# Patient Record
Sex: Male | Born: 1990 | Race: White | Hispanic: No | Marital: Married | Smoking: Never smoker
Health system: Southern US, Community
[De-identification: ages and names within clinical notes are randomized; demographics above are authoritative.]

## PROBLEM LIST (undated history)

## (undated) DIAGNOSIS — F419 Anxiety disorder, unspecified: Secondary | ICD-10-CM

## (undated) DIAGNOSIS — C6920 Malignant neoplasm of unspecified retina: Secondary | ICD-10-CM

## (undated) DIAGNOSIS — F988 Other specified behavioral and emotional disorders with onset usually occurring in childhood and adolescence: Secondary | ICD-10-CM

## (undated) HISTORY — DX: Anxiety disorder, unspecified: F41.9

## (undated) HISTORY — DX: Malignant neoplasm of unspecified retina: C69.20

## (undated) HISTORY — DX: Other specified behavioral and emotional disorders with onset usually occurring in childhood and adolescence: F98.8

---
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
## 2009-08-28 ENCOUNTER — Ambulatory Visit: Payer: Self-pay | Admitting: Physician Assistant

## 2011-05-03 IMAGING — CR DG KNEE COMPLETE 4+V*R*
1 series · 5 of 5 positions shown · non-contrast
Comparison: none

REASON FOR EXAM: football  injury 2wks pain side of rt knee
COMMENTS:

PROCEDURE:     DXR - DXR KNEE RT COMP WITH OBLIQUES  - August 28, 2009  [DATE]
RESULT:     No acute bony or joint abnormalities. Knee joint effusion cannot
be excluded.

[Series 1: view not recorded · 0.17mm/px · 5 of 5 slices shown]
[im 1/5]
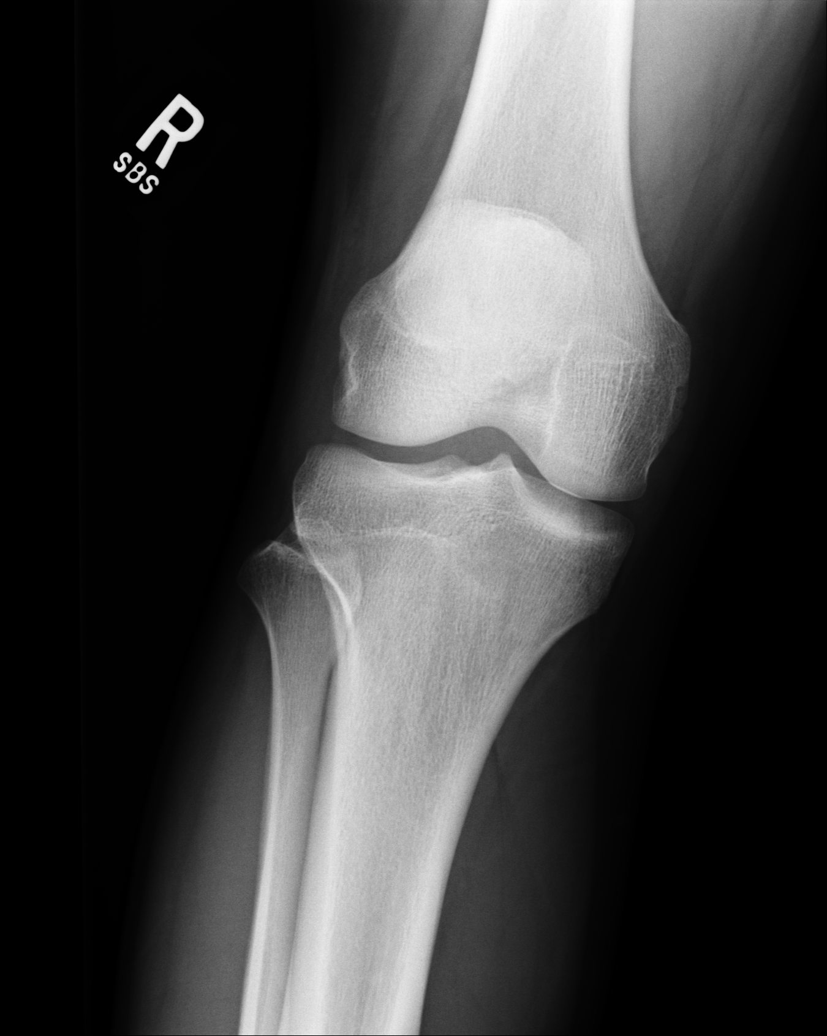
[im 2/5]
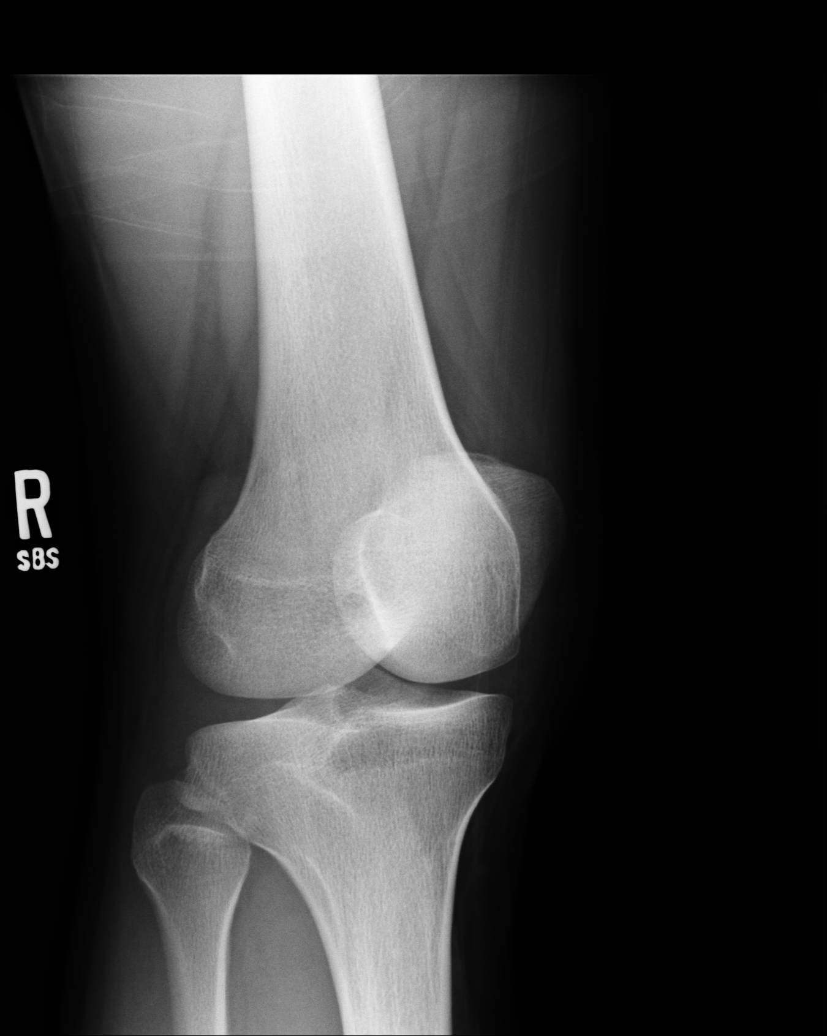
[im 3/5]
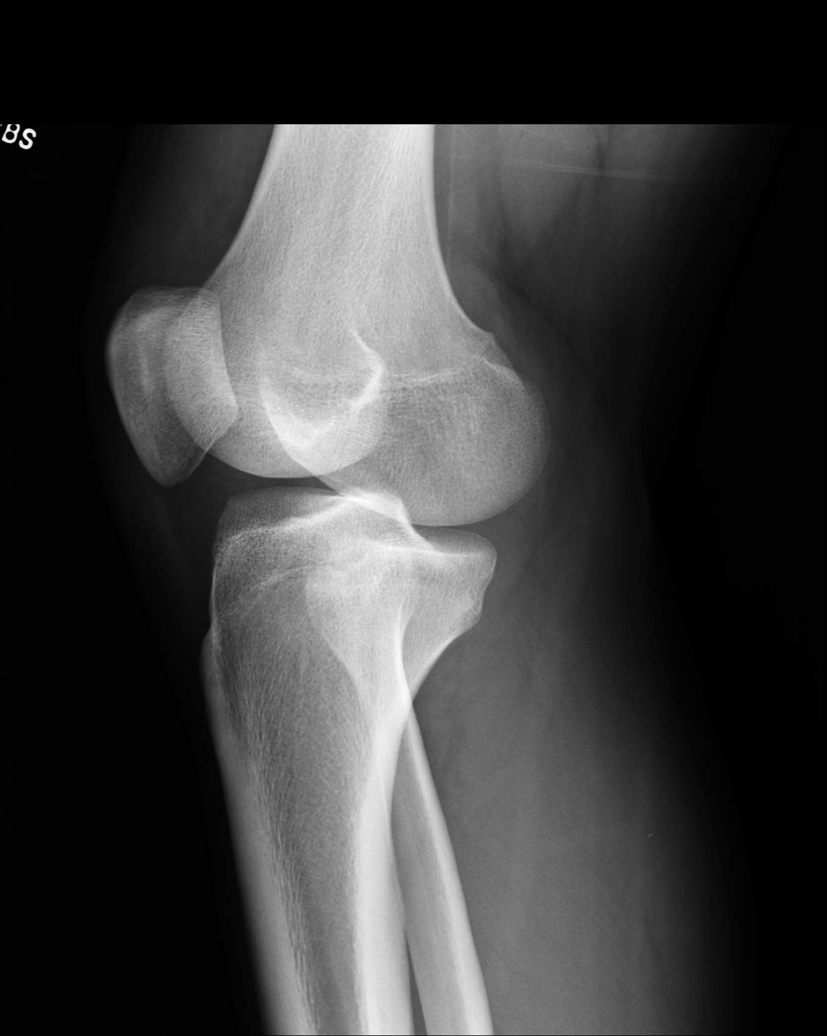
[im 4/5]
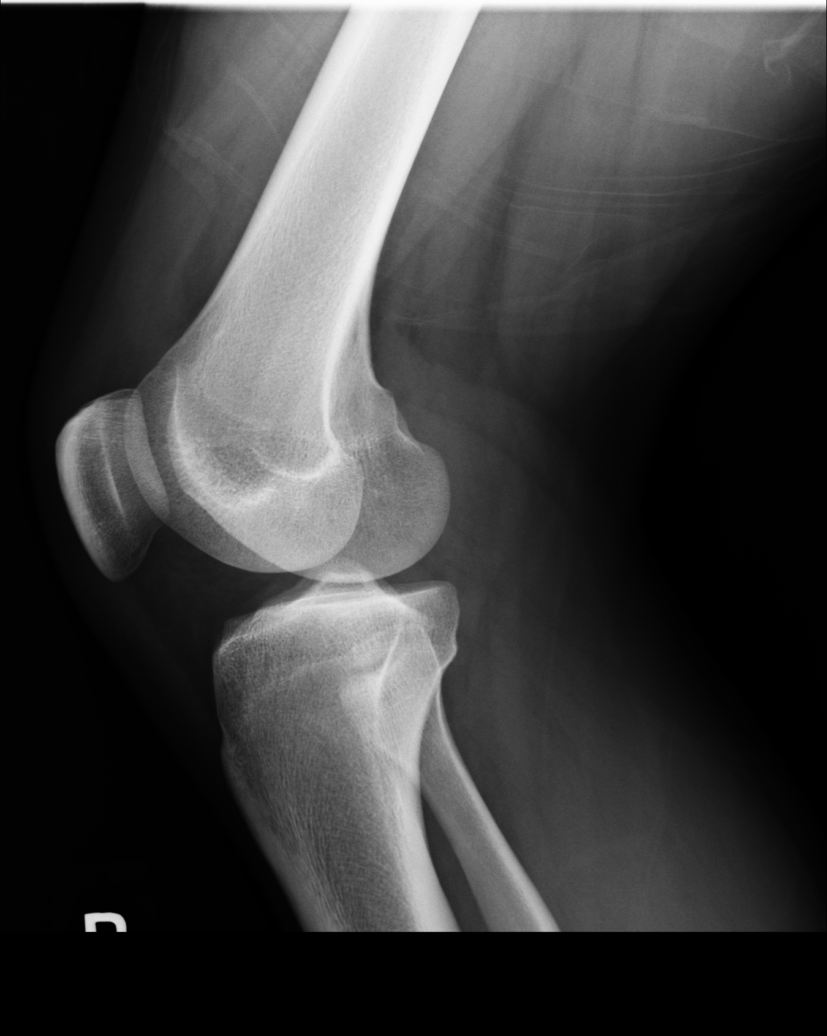
[im 5/5]
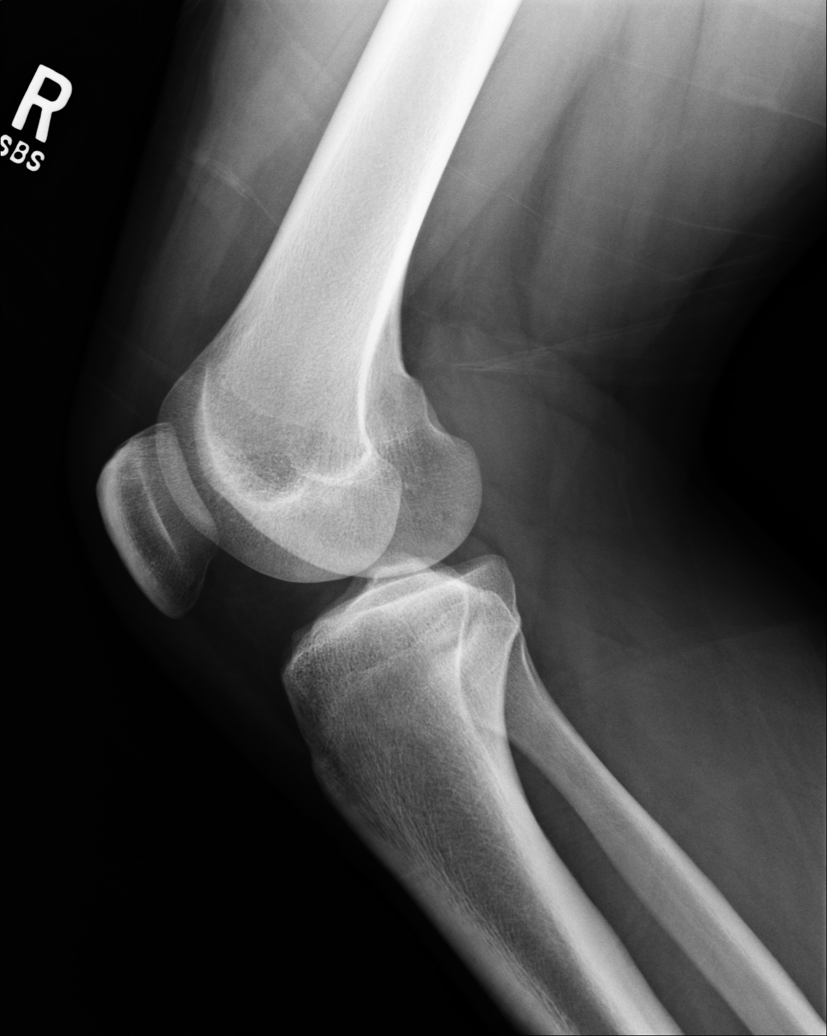

[5 of 5 positions shown; findings below may reference images not displayed]

IMPRESSION: 1. Knee joint effusion cannot be excluded.

2. No acute bony abnormality.

## 2016-10-22 ENCOUNTER — Encounter: Payer: Self-pay | Admitting: Emergency Medicine

## 2016-10-22 ENCOUNTER — Ambulatory Visit
Admission: EM | Admit: 2016-10-22 | Discharge: 2016-10-22 | Disposition: A | Discharge status: Home / Self Care | Payer: Managed Care, Other (non HMO) | Attending: Emergency Medicine | Admitting: Emergency Medicine

## 2016-10-22 DIAGNOSIS — K122 Cellulitis and abscess of mouth: Secondary | ICD-10-CM

## 2016-10-22 LAB — RAPID STREP SCREEN (MED CTR MEBANE ONLY): STREPTOCOCCUS, GROUP A SCREEN (DIRECT): NEGATIVE

## 2016-10-22 MED ORDER — AMOXICILLIN-POT CLAVULANATE 875-125 MG PO TABS: 1.0000 | ORAL_TABLET | Freq: Two times a day (BID) | ORAL | 0 refills | Status: DC

## 2016-10-22 MED ORDER — IBUPROFEN 800 MG PO TABS: 800.0000 mg | ORAL_TABLET | Freq: Three times a day (TID) | ORAL | 0 refills | Status: DC

## 2016-10-22 NOTE — ED Triage Notes (Signed)
Patient c/o sore throat, runny nose and swollen uvula that started yesterday.

## 2016-10-22 NOTE — ED Provider Notes (Signed)
HPI  SUBJECTIVE:  Robert Rose is a 25 y.o. male who presents with 2-3 days of nasal congestion, rhinorrhea, sore throat starting yesterday. He states that he reports "something stuck in my throat" this morning and noted that his uvula was swollen. He brings in a picture that shows a significantly swollen uvula. He tried Claritin and ibuprofen with improvement in symptoms. No aggravating factors. No nausea, vomiting, fevers. No difficulty breathing, voice changes, drooling, trismus. He denies sinus pain pressure, ear pain. He denies trauma to uvula, swallowing excessively hot liquids. He is not a smoker. No sick contacts. Is a past medical history seasonal allergies, hypertension. He is not on any medications for this. He is not a smoker. No history of diabetes, strep, mono. PMD: In [place].    History reviewed. No pertinent past medical history.  History reviewed. No pertinent surgical history.  History reviewed. No pertinent family history.  Social History  Substance Use Topics  . Smoking status: Never Smoker  . Smokeless tobacco: Never Used  . Alcohol use Yes    No current facility-administered medications for this encounter.   Current Outpatient Prescriptions:  .  amoxicillin-clavulanate (AUGMENTIN) 875-125 MG tablet, Take 1 tablet by mouth 2 (two) times daily., Disp: 20 tablet, Rfl: 0 .  ibuprofen (ADVIL,MOTRIN) 800 MG tablet, Take 1 tablet (800 mg total) by mouth 3 (three) times daily., Disp: 30 tablet, Rfl: 0  No Known Allergies   ROS  As noted in HPI.   Physical Exam  BP (!) 146/102 (BP Location: Right Arm)   Pulse (!) 117   Temp 98.8 F (37.1 C) (Oral)   Resp 17   Ht 6' (1.829 m)   Wt 269 lb (122 kg)   SpO2 98%   BMI 36.48 kg/m   Constitutional: Well developed, well nourished, no acute distress Eyes:  EOMI, conjunctiva normal bilaterally HENT: Normocephalic, atraumatic,mucus membranes moist. positive nasal congestion. No sinus tenderness. Positive  erythematous, swollen uvula. Uvula midline. Erythematous oropharynx, tonsils are unremarkable. No noticeable postnasal drip. Neck: No cervical lymphadenopathy Respiratory: Normal inspiratory effort Cardiovascular: Mild tachycardia GI: nondistended skin: No rash, skin intact Musculoskeletal: no deformities Neurologic: Alert & oriented x 3, no focal neuro deficits Psychiatric: Speech and behavior appropriate   ED Course   Medications - No data to display  Orders Placed This Encounter  Procedures  . Rapid strep screen    Standing Status:   Standing    Number of Occurrences:   1  . Culture, group A strep    Standing Status:   Standing    Number of Occurrences:   1    Results for orders placed or performed during the hospital encounter of 10/22/16 (from the past 24 hour(s))  Rapid strep screen     Status: None   Collection Time: 10/22/16 11:31 AM  Result Value Ref Range   Streptococcus, Group A Screen (Direct) NEGATIVE NEGATIVE   No results found.  ED Clinical Impression  Uvulitis   ED Assessment/Plan  Rapid strep negative. Presentation was consistent with uvulitis. Do not think that this is allergic uvulitis. Home with Augmentin, ibuprofen, cool fluids. Follow with PMD in [place] in several days. To the ER if gets worse.  Discussed labs,  MDM, plan and followup with patient Discussed sn/sx that should prompt return to the ED. Patient agrees with plan.   Meds ordered this encounter  Medications  . amoxicillin-clavulanate (AUGMENTIN) 875-125 MG tablet    Sig: Take 1 tablet by mouth 2 (two) times  daily.    Dispense:  20 tablet    Refill:  0  . ibuprofen (ADVIL,MOTRIN) 800 MG tablet    Sig: Take 1 tablet (800 mg total) by mouth 3 (three) times daily.    Dispense:  30 tablet    Refill:  0    *This clinic note was created using [employer]. Therefore, there may be occasional mistakes despite careful proofreading.  ?    Melynda Ripple,  MD 10/22/16 1843

## 2016-10-25 LAB — CULTURE, GROUP A STREP (THRC)

## 2017-12-19 ENCOUNTER — Ambulatory Visit (INDEPENDENT_AMBULATORY_CARE_PROVIDER_SITE_OTHER): Payer: Managed Care, Other (non HMO) | Admitting: Family Medicine

## 2017-12-19 ENCOUNTER — Encounter: Payer: Self-pay | Admitting: Family Medicine

## 2017-12-19 VITALS — BP 122/84 | HR 100 | Temp 99.5°F | Resp 16 | Ht 72.0 in | Wt 244.6 lb

## 2017-12-19 DIAGNOSIS — R030 Elevated blood-pressure reading, without diagnosis of hypertension: Secondary | ICD-10-CM | POA: Diagnosis not present

## 2017-12-19 DIAGNOSIS — Z7689 Persons encountering health services in other specified circumstances: Secondary | ICD-10-CM | POA: Diagnosis not present

## 2017-12-19 DIAGNOSIS — C6921 Malignant neoplasm of right retina: Secondary | ICD-10-CM

## 2017-12-19 DIAGNOSIS — C6920 Malignant neoplasm of unspecified retina: Secondary | ICD-10-CM | POA: Insufficient documentation

## 2017-12-19 NOTE — Patient Instructions (Addendum)
Thank you for coming to the office today.  1. As discussed, I would like you to monitor blood pressure at home, different times, keep log, goal is < 135/85, HR goal 70-90  If still elevated due to stress / anxiety, we will check blood work and determine next step, can return sooner if needed  Send message or call with results within 2 weeks  Also maybe  AMBULATORY BP MONITORING  [facility] only - Location [place] bill $159, out of pocket, not covered by ins - We get bill to collect from patient - Handwritten rx - patient calls ahead - 24 monitor, approx size 5 x 3 x 1 inches, take off to shower, less activity, auto checks BP q 20 min  3.  Keep track of your anxiety and monitor it and we can review again in future    DUE for FASTING BLOOD WORK (no food or drink after midnight before the lab appointment, only water or coffee without cream/sugar on the morning of)  SCHEDULE "Lab Only" visit in the morning at the clinic for lab draw in [place]   - Make sure Lab Only appointment is at about 1 week before your next appointment, so that results will be available  For Lab Results, once available within 2-3 days of blood draw, you can can log in to MyChart online to view your results and a brief explanation. Also, we can discuss results at next follow-up visit.   Please schedule a Follow-up Appointment to: Return in about 5 weeks (around 01/23/2018) for Annual Physical, may schedule 8am.   If you have any other questions or concerns, please feel free to call the office or send a message through [place]. You may also schedule an earlier appointment if necessary.  Additionally, you may be receiving a survey about your experience at our office within a few days to 1 week by e-mail or mail. We value your feedback.  Nobie Putnam, DO [place]

## 2017-12-19 NOTE — Progress Notes (Signed)
Subjective:    Patient ID: Robert Rose, male    DOB: 25-Oct-1991, 27 y.o.   MRN: [id number]  Robert Rose is a 27 y.o. male presenting on 12/19/2017 for Establish Care (anxiety, HTN, ADD, weight)  He moved back to area, used to live in [facility], and now he has moved back from [place], [place]. He works for [employer] and commutes. He has a son, Robert Rose, 56 months old. Previous PCP temporarily at [facility], 2015.  Mother is a Therapist, art at [employer].  HPI  Elevated BP without dx HTN / White Coat HTN Reports that he can check BP at home, his mother has an arm BP cuff, he checks it on Sundays with 150/112, he checks his own pulse often throughout the week typical 70s no higher than 90. - He states he has tried going to several dentist and they do not agree to do any procedure on his teeth due to elevated BP he needs a regular doctor Current Meds - Never on meds   Lifestyle: - Diet: He has been on keto diet since August 2018, wt loss down from 260s to 239-240s, good results. Does some intermediate fasting. He thinks the keto diet helps him focus actually. - Low salt options, but does use some sauces and condiments that have sodium - Exercise: Avg 30k steps daily - April through August. He does some work out home videos with wife. - Substances: Used smokeless tobacco dip for few years. Consumes caffeine 2-3 days a 16-20 oz coffee, Coke Zero x 2 daily.  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 He decided to come off these meds at that time. - Fam history with Father dx with late determined ADD age 29s, he is on low dose med  Additional Family History: - Sister has some situational anxiety and passing out episodes. She is a special ed teacher  Health Maintenance: Due for Flu Shot, declines today despite counseling on benefits  Depression screen PHQ 2/9 12/19/2017  Decreased Interest 0  Down, Depressed, Hopeless 0  PHQ - 2 Score 0   GAD 7 : Generalized Anxiety Score 12/19/2017  Nervous, Anxious, on Edge 1  Control/stop worrying 1  Worry too much - different things 2  Trouble relaxing 0  Restless 0  Easily annoyed or irritable 2  Afraid - awful might happen 2  Total GAD 7 Score 8  Anxiety Difficulty Not difficult at all    Past Medical History:  Diagnosis Date  . ADD (attention deficit disorder)   . Anxiety   . 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 No pertinent surgical history. Social History   Socioeconomic History  . Marital status: Single    Spouse name: Not on file  . Number of children: Not on file  . Years  of education: Not on file  . Highest education level: Not on file  Social Needs  . Financial resource strain: Not on file  . Food insecurity - worry: Not on file  . Food insecurity - inability: Not on file  . Transportation needs - medical: Not on file  . Transportation needs - non-medical: Not on file  Occupational History  . Not on file  Tobacco Use  . Smoking status: Never Smoker  . Smokeless tobacco: Never Used  Substance and Sexual Activity  . Alcohol use: Yes  . Drug use: No  . Sexual activity: Not on file  Other Topics Concern  . Not on file  Social History Narrative  . Not on file   Family History  Problem Relation Age of Onset    . Hypertension Mother   . Other Mother        Congenital bicuspid aortic valve, and his cousin  . Hypertension Father   . ADD / ADHD Father 40       mild  . Retinoblastoma Brother        eye removal surgery   Current Outpatient Medications on File Prior to Visit  Medication Sig  . ibuprofen (ADVIL,MOTRIN) 800 MG tablet Take 1 tablet (800 mg total) by mouth 3 (three) times daily.   No current facility-administered medications on file prior to visit.     Review of Systems  Constitutional: Negative for activity change, appetite change, chills, diaphoresis, fatigue and fever.  HENT: Positive for postnasal drip. Negative for congestion and hearing loss.   Eyes: Negative for visual disturbance.  Respiratory: Negative for cough, chest tightness, shortness of breath and wheezing.   Cardiovascular: Negative for chest pain, palpitations and leg swelling.  Gastrointestinal: Negative for abdominal pain, constipation, diarrhea, nausea and vomiting.  Endocrine: Negative for cold intolerance.  Genitourinary: Negative for dysuria, frequency and hematuria.  Musculoskeletal: Negative for arthralgias and neck pain.  Skin: Negative for rash.  Allergic/Immunologic: Negative for environmental allergies.  Neurological: Negative for dizziness, weakness, light-headedness, numbness and headaches.  Hematological: Negative for adenopathy.  Psychiatric/Behavioral: Negative for behavioral problems, dysphoric mood and sleep disturbance. The patient is nervous/anxious.    Per HPI unless specifically indicated above     Objective:    BP 122/84 (BP Location: Left Arm, Cuff Size: Normal)   Pulse 100   Temp 99.5 F (37.5 C) (Oral)   Resp 16   Ht 6' (1.829 m)   Wt 244 lb 9.6 oz (110.9 kg)   BMI 33.17 kg/m   Wt Readings from Last 3 Encounters:  12/19/17 244 lb 9.6 oz (110.9 kg)  10/22/16 269 lb (122 kg)    Physical Exam  Constitutional: He is oriented to person, place, and time. He appears  well-developed and well-nourished. No distress.  Well-appearing, comfortable, cooperative  HENT:  Head: Normocephalic and atraumatic.  Mouth/Throat: Oropharynx is clear and moist.  Eyes: Conjunctivae are normal. Right eye exhibits no discharge. Left eye exhibits no discharge.  Neck: Normal range of motion. Neck supple. No thyromegaly present.  Cardiovascular: Normal rate, regular rhythm, normal heart sounds and intact distal pulses.  No murmur heard. Pulmonary/Chest: Effort normal and breath sounds normal. No respiratory distress. He has no wheezes. He has no rales.  Musculoskeletal: Normal range of motion. He exhibits no edema.  Lymphadenopathy:    He has no cervical adenopathy.  Neurological: He is alert and oriented to person, place, and time.  Skin: Skin is warm and dry. No rash noted. He is  not diaphoretic. No erythema.  Psychiatric: He has a normal mood and affect. His behavior is normal.  Well groomed, good eye contact, normal speech and thoughts  Nursing note and vitals reviewed.  Results for orders placed or performed during the hospital encounter of 10/22/16  Rapid strep screen  Result Value Ref Range   Streptococcus, Group A Screen (Direct) NEGATIVE NEGATIVE  Culture, group A strep  Result Value Ref Range   Specimen Description THROAT    Special Requests NONE Reflexed from [id number]    Culture      NO GROUP A STREP (S.PYOGENES) ISOLATED Performed at [facility]    Report Status 10/25/2016 FINAL       Assessment & Plan:   Problem List Items Addressed This Visit    Elevated BP without diagnosis of hypertension - Primary    Elevated BP initially and tachycardia with acute stress response to BP, concern white coat HTN, similar to prior reports and readings - Improved on manual re-check at end of visit actually in normal range, patient felt better less anxious, still tachy but improved - Home BP readings limited available, does not have own cuff  No known  complications     Plan:  1. Discussed diagnosis and uncertain if HTN vs stress/anxiety white coat - emphasis on monitoring at home, establish routine to avoid provoking anxiety, if can demonstrate normal BP at home then can confirm does not have HTN 2. Encourage improved lifestyle - low sodium diet, regular exercise 3. Start monitor BP outside office, bring readings to next visit, if persistently >135/85 or new symptoms notify office sooner 4. Follow-up 4-6 weeks annual and labs for further review - Discuss anxiety further at next visit and review dx and treatment options if indicated - Future consider Ambulatory BP monitoring cuff through [facility] if needed for diagnostics, also can consider cardiology referral for 2nd opinion      Retinoblastoma ([place])    Stable >20+ years without recurrence or complication S/p radiation therapy soon after dx at young age 62 Fam history with brother       Other Visit Diagnoses    Encounter to establish care with new doctor          No orders of the defined types were placed in this encounter.   Follow up plan: Return in about 5 weeks (around 01/23/2018) for Annual Physical, may schedule 8am.  Future labs to be ordered.  Nobie Putnam, [facility] 12/20/2017, 12:32 AM

## 2017-12-20 ENCOUNTER — Encounter: Payer: Self-pay | Admitting: Family Medicine

## 2017-12-20 NOTE — Assessment & Plan Note (Signed)
Stable >20+ years without recurrence or complication ?S/p radiation therapy soon after dx at young age 27 ?Fam history with brother ?

## 2017-12-20 NOTE — Assessment & Plan Note (Addendum)
Elevated BP initially and tachycardia with acute stress response to BP, concern white coat HTN, similar to prior reports and readings - Improved on manual re-check at end of visit actually in normal range, patient felt better less anxious, still tachy but improved - Home BP readings limited available, does not have own cuff  No known complications     Plan:  1. Discussed diagnosis and uncertain if HTN vs stress/anxiety white coat - emphasis on monitoring at home, establish routine to avoid provoking anxiety, if can demonstrate normal BP at home then can confirm does not have HTN 2. Encourage improved lifestyle - low sodium diet, regular exercise 3. Start monitor BP outside office, bring readings to next visit, if persistently >135/85 or new symptoms notify office sooner 4. Follow-up 4-6 weeks annual and labs for further review - Discuss anxiety further at next visit and review dx and treatment options if indicated - Future consider Ambulatory BP monitoring cuff through [facility] if needed for diagnostics, also can consider cardiology referral for 2nd opinion

## 2017-12-24 ENCOUNTER — Encounter: Payer: Self-pay | Admitting: Family Medicine

## 2017-12-25 ENCOUNTER — Other Ambulatory Visit: Payer: Self-pay | Admitting: Family Medicine

## 2017-12-25 DIAGNOSIS — Z131 Encounter for screening for diabetes mellitus: Secondary | ICD-10-CM

## 2017-12-25 DIAGNOSIS — Z0000 Encounter for general adult medical examination without abnormal findings: Secondary | ICD-10-CM

## 2017-12-25 DIAGNOSIS — Z13220 Encounter for screening for lipoid disorders: Secondary | ICD-10-CM

## 2017-12-25 DIAGNOSIS — R030 Elevated blood-pressure reading, without diagnosis of hypertension: Secondary | ICD-10-CM

## 2018-01-09 ENCOUNTER — Other Ambulatory Visit: Payer: Managed Care, Other (non HMO)

## 2018-01-09 DIAGNOSIS — Z131 Encounter for screening for diabetes mellitus: Secondary | ICD-10-CM

## 2018-01-09 DIAGNOSIS — Z0000 Encounter for general adult medical examination without abnormal findings: Secondary | ICD-10-CM

## 2018-01-09 DIAGNOSIS — Z13220 Encounter for screening for lipoid disorders: Secondary | ICD-10-CM

## 2018-01-09 DIAGNOSIS — R030 Elevated blood-pressure reading, without diagnosis of hypertension: Secondary | ICD-10-CM

## 2018-01-10 LAB — COMPLETE METABOLIC PANEL WITH GFR
AG RATIO: 1.8 (calc) (ref 1.0–2.5)
ALBUMIN MSPROF: 4.8 g/dL (ref 3.6–5.1)
ALKALINE PHOSPHATASE (APISO): 58 U/L (ref 40–115)
ALT: 21 U/L (ref 9–46)
AST: 19 U/L (ref 10–40)
BILIRUBIN TOTAL: 0.6 mg/dL (ref 0.2–1.2)
BUN: 21 mg/dL (ref 7–25)
CO2: 26 mmol/L (ref 20–32)
Calcium: 9.7 mg/dL (ref 8.6–10.3)
Chloride: 104 mmol/L (ref 98–110)
Creat: 1.01 mg/dL (ref 0.60–1.35)
GFR, EST AFRICAN AMERICAN: 117 mL/min/{1.73_m2} (ref >=60)
GFR, Est Non African American: 101 mL/min/{1.73_m2} (ref >=60)
Globulin: 2.6 g/dL (calc) (ref 1.9–3.7)
Glucose, Bld: 94 mg/dL (ref 65–99)
POTASSIUM: 4.2 mmol/L (ref 3.5–5.3)
Sodium: 140 mmol/L (ref 135–146)
TOTAL PROTEIN: 7.4 g/dL (ref 6.1–8.1)

## 2018-01-10 LAB — CBC WITH DIFFERENTIAL/PLATELET
BASOS ABS: 49 {cells}/uL (ref 0–200)
Basophils Relative: 0.8 %
EOS ABS: 293 {cells}/uL (ref 15–500)
Eosinophils Relative: 4.8 %
HCT: 43.5 % (ref 38.5–50.0)
HEMOGLOBIN: 14.9 g/dL (ref 13.2–17.1)
Lymphs Abs: 2214 cells/uL (ref 850–3900)
MCH: 29.2 pg (ref 27.0–33.0)
MCHC: 34.3 g/dL (ref 32.0–36.0)
MCV: 85.1 fL (ref 80.0–100.0)
MONOS PCT: 11.2 %
MPV: 9.2 fL (ref 7.5–12.5)
Neutro Abs: 2861 cells/uL (ref 1500–7800)
Neutrophils Relative %: 46.9 %
PLATELETS: 285 10*3/uL (ref 140–400)
RBC: 5.11 10*6/uL (ref 4.20–5.80)
RDW: 12.7 % (ref 11.0–15.0)
TOTAL LYMPHOCYTE: 36.3 %
WBC: 6.1 10*3/uL (ref 3.8–10.8)
WBCMIX: 683 {cells}/uL (ref 200–950)

## 2018-01-10 LAB — HEMOGLOBIN A1C
Hgb A1c MFr Bld: 5 % of total Hgb (ref <5.7)
Mean Plasma Glucose: 97 (calc)
eAG (mmol/L): 5.4 (calc)

## 2018-01-10 LAB — LIPID PANEL
CHOL/HDL RATIO: 4.4 (calc) (ref <5.0)
Cholesterol: 160 mg/dL (ref <200)
HDL: 36 mg/dL — AB (ref >40)
LDL CHOLESTEROL (CALC): 106 mg/dL — AB
Non-HDL Cholesterol (Calc): 124 mg/dL (calc) (ref <130)
Triglycerides: 87 mg/dL (ref <150)

## 2018-01-11 ENCOUNTER — Encounter: Payer: Self-pay | Admitting: Family Medicine

## 2018-01-11 ENCOUNTER — Other Ambulatory Visit: Payer: Self-pay | Admitting: Family Medicine

## 2018-01-11 ENCOUNTER — Ambulatory Visit (INDEPENDENT_AMBULATORY_CARE_PROVIDER_SITE_OTHER): Payer: Managed Care, Other (non HMO) | Admitting: Family Medicine

## 2018-01-11 VITALS — BP 130/86 | HR 98 | Temp 98.7°F | Resp 16 | Ht 72.0 in | Wt 247.0 lb

## 2018-01-11 DIAGNOSIS — E786 Lipoprotein deficiency: Secondary | ICD-10-CM

## 2018-01-11 DIAGNOSIS — Z0000 Encounter for general adult medical examination without abnormal findings: Secondary | ICD-10-CM

## 2018-01-11 DIAGNOSIS — R030 Elevated blood-pressure reading, without diagnosis of hypertension: Secondary | ICD-10-CM | POA: Diagnosis not present

## 2018-01-11 DIAGNOSIS — C6921 Malignant neoplasm of right retina: Secondary | ICD-10-CM

## 2018-01-11 NOTE — Assessment & Plan Note (Signed)
Improved now normal range BP on initial check, did not repeat today - Home BP readings very detailed multiple readings most days No known complications Concern for future Pre-HTN as a possibility. 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 Also likely some contribution with wt/obesity    Plan:  1. Encourage improved lifestyle - low sodium diet, regular exercise 2. Start monitor BP outside office, bring readings to next visit, if persistently >135/85 or new symptoms notify office sooner 3. Follow-up 1 year for annual

## 2018-01-11 NOTE — Patient Instructions (Addendum)
Thank you for coming to the office today.  1. Completed Annual Physical today.  2. Keep up the great work.  3.  Lipid Panel     Component Value Date/Time   CHOL 160 01/09/2018 0801   TRIG 87 01/09/2018 0801   HDL 36 (L) 01/09/2018 0801   CHOLHDL 4.4 01/09/2018 0801   Recent Labs    01/09/18 0801  HGBA1C 5.0   Keep monitoring BP at home. I am encouraged that this will stay stable for now, keep improving lifestyle, diet, exercise and some wt loss to help as well.  DUE for FASTING BLOOD WORK (no food or drink after midnight before the lab appointment, only water or coffee without cream/sugar on the morning of)  SCHEDULE "Lab Only" visit in the morning at the clinic for lab draw in 1 YEAR  - Make sure Lab Only appointment is at about 1 week before your next appointment, so that results will be available  For Lab Results, once available within 2-3 days of blood draw, you can can log in to MyChart online to view your results and a brief explanation. Also, we can discuss results at next follow-up visit.   Please schedule a Follow-up Appointment to: Return in about 1 year (around 01/11/2019) for Annual Physical.    If you have any other questions or concerns, please feel free to call the office or send a message through [place]. You may also schedule an earlier appointment if necessary.  Additionally, you may be receiving a survey about your experience at our office within a few days to 1 week by e-mail or mail. We value your feedback.  Nobie Putnam, DO [place]

## 2018-01-11 NOTE — Progress Notes (Addendum)
Subjective:    Patient ID: Robert Rose, male    DOB: 1991/09/21, 27 y.o.   MRN: [id number]  Robert Rose is a 27 y.o. male presenting on 01/11/2018 for Annual Exam   HPI   Here for Annual Physical and Lab Review  Elevated BP without dx HTN / White Coat HTN - Last visit with me 12/19/17, for initial visit as new patient for same problem, treated with close home monitoring on BP, see prior notes for background information. - Interval update with reviewed home BP log detailed readings avg 130/84, seems to have mostly controlled numbers, occasional still elevated to 135-140/85-90, but he attributes this to being anxious when checking BP - Today patient reports no new concerns, feels well, he feels more comfortable checking BP at home now, and even in office did not think his body reacted with as much anxiety when BP checked - He is asking about letter to bring to dentist for proceeding with dental work/cleaning etc, to let them know his BP is controlled Current Meds - Never on meds   Lifestyle: - Diet: Switched off keto diet now on improved balanced point system diet, had mild wt gain recently but still improving. - Low salt options, but does use some sauces and condiments that have sodium - Exercise: Avg 30k steps daily - April through August. He does some work out home videos with wife. - Substances: Used smokeless tobacco dip for few years. Consumes caffeine 2-3 days a 16-20 oz coffee, Coke Zero x 2 daily. Has tried to reduce with more water intake Denies CP, dyspnea, HA, edema, dizziness / lightheadedness  Low HDL - Reports prior result at work with cholesterol screening through work had HDL 39, LDL low 80s. Last lipid panel 12/2017, controlled except mild low HDL   Health Maintenance: UTD TDap Declined Flu Shot despite counseling Declines routine HIV screen  Depression screen [facility] 2/9 01/11/2018 12/19/2017  Decreased Interest 0 0  Down, Depressed, Hopeless 0 0  PHQ - 2 Score  0 0    Past Medical History:  Diagnosis Date  . ADD (attention deficit disorder)   . Anxiety   . 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 No pertinent surgical history. Social History   Socioeconomic History  . Marital status: Married    Spouse name: Not on file  . Number of children: Not on file  . Years of education: Not on file  . Highest education level: Not on file  Social Needs  . Financial resource strain: Not on file  . Food insecurity - worry: Not on file  . Food insecurity - inability: Not on file  . Transportation needs - medical: Not on file  . Transportation needs - non-medical: Not on file  Occupational History  . Not on file  Tobacco Use  . Smoking status: Never Smoker  . Smokeless tobacco: Never Used  Substance and Sexual Activity  . Alcohol use: Yes  . Drug use: No  . Sexual activity: Not on file  Other Topics Concern  . Not on file  Social History Narrative  . Not on file   Family History  Problem Relation Age of Onset  . Hypertension Mother   . Other Mother        Congenital bicuspid aortic valve, and his cousin  . Hypertension Father   . ADD / ADHD Father 40       mild  . Retinoblastoma Brother  eye removal surgery   Current Outpatient Medications on File Prior to Visit  Medication Sig  . ibuprofen (ADVIL,MOTRIN) 800 MG tablet Take 1 tablet (800 mg total) by mouth 3 (three) times daily.   No current facility-administered medications on file prior to visit.     Review of Systems  Constitutional: Negative for activity change, appetite change, chills, diaphoresis, fatigue, fever and unexpected weight change.  HENT: Negative for congestion, hearing loss and sinus pressure.   Eyes: Negative for visual disturbance.  Respiratory: Negative for apnea, cough, choking, chest tightness, shortness of breath and wheezing.   Cardiovascular: Negative for chest pain, palpitations and leg swelling.  Gastrointestinal: Negative for  abdominal pain, anal bleeding, blood in stool, constipation, diarrhea, nausea and vomiting.  Endocrine: Negative for cold intolerance and polyuria.  Genitourinary: Negative for decreased urine volume, difficulty urinating, dysuria, frequency, hematuria, scrotal swelling, testicular pain and urgency.  Musculoskeletal: Negative for arthralgias and neck pain.  Skin: Negative for rash.  Allergic/Immunologic: Negative for environmental allergies.  Neurological: Negative for dizziness, weakness, light-headedness, numbness and headaches.  Hematological: Negative for adenopathy.  Psychiatric/Behavioral: Negative for behavioral problems, dysphoric mood and sleep disturbance. The patient is not nervous/anxious.    Per HPI unless specifically indicated above     Objective:    BP 130/86   Pulse 98   Temp 98.7 F (37.1 C) (Oral)   Resp 16   Ht 6' (1.829 m)   Wt 247 lb (112 kg)   SpO2 99%   BMI 33.50 kg/m   Wt Readings from Last 3 Encounters:  01/11/18 247 lb (112 kg)  12/19/17 244 lb 9.6 oz (110.9 kg)  10/22/16 269 lb (122 kg)    Physical Exam  Constitutional: He is oriented to person, place, and time. He appears well-developed and well-nourished. No distress.  Well-appearing, comfortable, cooperative  HENT:  Head: Normocephalic and atraumatic.  Mouth/Throat: Oropharynx is clear and moist.  Frontal / maxillary sinuses non-tender. Nares patent without purulence or edema. Bilateral TMs clear without erythema, effusion or bulging. Oropharynx clear without erythema, exudates, edema or asymmetry.  Eyes: Conjunctivae and EOM are normal. Pupils are equal, round, and reactive to light. Right eye exhibits no discharge. Left eye exhibits no discharge.  Neck: Normal range of motion. Neck supple. No thyromegaly present.  Cardiovascular: Normal rate, regular rhythm, normal heart sounds and intact distal pulses.  No murmur heard. Pulmonary/Chest: Effort normal and breath sounds normal. No respiratory  distress. He has no wheezes. He has no rales.  Abdominal: Soft. Bowel sounds are normal. He exhibits no distension and no mass. There is no tenderness.  Musculoskeletal: Normal range of motion. He exhibits no edema or tenderness.  Upper / Lower Extremities: - Normal muscle tone, strength bilateral upper extremities 5/5, lower extremities 5/5  Lymphadenopathy:    He has no cervical adenopathy.  Neurological: He is alert and oriented to person, place, and time.  Distal sensation intact to light touch all extremities  Skin: Skin is warm and dry. No rash noted. He is not diaphoretic. No erythema.  Psychiatric: He has a normal mood and affect. His behavior is normal.  Well groomed, good eye contact, normal speech and thoughts  Nursing note and vitals reviewed.  Results for orders placed or performed in visit on 01/09/18  Lipid panel  Result Value Ref Range   Cholesterol 160 <200 mg/dL   HDL 36 (L) >40 mg/dL   Triglycerides 87 <150 mg/dL   LDL Cholesterol (Calc) 106 (H) mg/dL (calc)  Total CHOL/HDL Ratio 4.4 <5.0 (calc)   Non-HDL Cholesterol (Calc) 124 <130 mg/dL (calc)  CBC with Differential/Platelet  Result Value Ref Range   WBC 6.1 3.8 - 10.8 Thousand/uL   RBC 5.11 4.20 - 5.80 Million/uL   Hemoglobin 14.9 13.2 - 17.1 g/dL   HCT 43.5 38.5 - 50.0 %   MCV 85.1 80.0 - 100.0 fL   MCH 29.2 27.0 - 33.0 pg   MCHC 34.3 32.0 - 36.0 g/dL   RDW 12.7 11.0 - 15.0 %   Platelets 285 140 - 400 Thousand/uL   MPV 9.2 7.5 - 12.5 fL   Neutro Abs 2,861 1,500 - 7,800 cells/uL   Lymphs Abs 2,214 850 - 3,900 cells/uL   WBC mixed population 683 200 - 950 cells/uL   Eosinophils Absolute 293 15 - 500 cells/uL   Basophils Absolute 49 0 - 200 cells/uL   Neutrophils Relative % 46.9 %   Total Lymphocyte 36.3 %   Monocytes Relative 11.2 %   Eosinophils Relative 4.8 %   Basophils Relative 0.8 %  Hemoglobin A1c  Result Value Ref Range   Hgb A1c MFr Bld 5.0 <5.7 % of total Hgb   Mean Plasma Glucose 97  (calc)   eAG (mmol/L) 5.4 (calc)  COMPLETE METABOLIC PANEL WITH GFR  Result Value Ref Range   Glucose, Bld 94 65 - 99 mg/dL   BUN 21 7 - 25 mg/dL   Creat 1.01 0.60 - 1.35 mg/dL   GFR, Est Non African American 101 > OR = 60 mL/min/1.99m2   GFR, Est African American 117 > OR = 60 mL/min/1.21m2   BUN/Creatinine Ratio NOT APPLICABLE 6 - 22 (calc)   Sodium 140 135 - 146 mmol/L   Potassium 4.2 3.5 - 5.3 mmol/L   Chloride 104 98 - 110 mmol/L   CO2 26 20 - 32 mmol/L   Calcium 9.7 8.6 - 10.3 mg/dL   Total Protein 7.4 6.1 - 8.1 g/dL   Albumin 4.8 3.6 - 5.1 g/dL   Globulin 2.6 1.9 - 3.7 g/dL (calc)   AG Ratio 1.8 1.0 - 2.5 (calc)   Total Bilirubin 0.6 0.2 - 1.2 mg/dL   Alkaline phosphatase (APISO) 58 40 - 115 U/L   AST 19 10 - 40 U/L   ALT 21 9 - 46 U/L      Assessment & Plan:   Problem List Items Addressed This Visit    Elevated BP without diagnosis of hypertension    Improved now normal range BP on initial check, did not repeat today - Home BP readings very detailed multiple readings most days No known complications Concern for future Pre-HTN as a possibility. 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 Also likely some contribution with wt/obesity    Plan:  1. Encourage improved lifestyle - low sodium diet, regular exercise 2. Start monitor BP outside office, bring readings to next visit, if persistently >135/85 or new symptoms notify office sooner 3. Follow-up 1 year for annual  Additionally, letter written for patient stating that his BP has been closely monitored and is within appropriate range, requesting dentist to proceed with routine dental work/cleaning and not limit treating him due to elevated BP.      Low HDL (under 40)    Mostly controlled lipids on lifestyle. Mildly low HDL Last lipid panel 12/2017  Plan: 1. Encourage improved lifestyle - low carb/cholesterol, reduce portion size, continue improving regular exercise - F/u yearly lipid        Other  Visit Diagnoses    Annual physical exam    -  Primary Health maintenance updated Encouraged improved healthy lifestyle Home BP monitoring      No orders of the defined types were placed in this encounter.  Follow up plan: Return in about 1 year (around 01/11/2019) for Annual Physical.   Future labs ordered for 12/2018  Nobie Putnam, [facility] 01/11/2018, 9:04 AM

## 2018-01-11 NOTE — Assessment & Plan Note (Signed)
Mostly controlled lipids on lifestyle. Mildly low HDL Last lipid panel 12/2017  Plan: 1. Encourage improved lifestyle - low carb/cholesterol, reduce portion size, continue improving regular exercise - F/u yearly lipid

## 2018-02-21 ENCOUNTER — Encounter: Payer: Self-pay | Admitting: Family Medicine

## 2018-02-21 DIAGNOSIS — J069 Acute upper respiratory infection, unspecified: Secondary | ICD-10-CM

## 2018-02-22 MED ORDER — BENZONATATE 100 MG PO CAPS: 100.0000 mg | ORAL_CAPSULE | Freq: Three times a day (TID) | ORAL | 0 refills | Status: DC | PRN

## 2018-02-22 NOTE — Addendum Note (Signed)
Addended by: Olin Hauser on: 02/22/2018 05:24 PM   Modules accepted: Orders

## 2018-03-26 ENCOUNTER — Other Ambulatory Visit: Payer: Managed Care, Other (non HMO)

## 2018-03-28 ENCOUNTER — Encounter: Payer: Managed Care, Other (non HMO) | Admitting: Family Medicine

## 2018-05-13 ENCOUNTER — Encounter: Payer: Self-pay | Admitting: Family Medicine

## 2018-05-14 ENCOUNTER — Ambulatory Visit
Admission: EM | Admit: 2018-05-14 | Discharge: 2018-05-14 | Disposition: A | Discharge status: Home / Self Care | Payer: Managed Care, Other (non HMO) | Attending: Emergency Medicine | Admitting: Emergency Medicine

## 2018-05-14 ENCOUNTER — Other Ambulatory Visit: Payer: Self-pay

## 2018-05-14 ENCOUNTER — Encounter: Payer: Self-pay | Admitting: Emergency Medicine

## 2018-05-14 DIAGNOSIS — R030 Elevated blood-pressure reading, without diagnosis of hypertension: Secondary | ICD-10-CM

## 2018-05-14 DIAGNOSIS — J069 Acute upper respiratory infection, unspecified: Secondary | ICD-10-CM

## 2018-05-14 DIAGNOSIS — H6122 Impacted cerumen, left ear: Secondary | ICD-10-CM

## 2018-05-14 DIAGNOSIS — H6692 Otitis media, unspecified, left ear: Secondary | ICD-10-CM

## 2018-05-14 DIAGNOSIS — R0981 Nasal congestion: Secondary | ICD-10-CM

## 2018-05-14 DIAGNOSIS — J029 Acute pharyngitis, unspecified: Secondary | ICD-10-CM | POA: Diagnosis not present

## 2018-05-14 LAB — RAPID STREP SCREEN (MED CTR MEBANE ONLY): Streptococcus, Group A Screen (Direct): NEGATIVE

## 2018-05-14 MED ORDER — AMOXICILLIN 875 MG PO TABS: 875.0000 mg | ORAL_TABLET | Freq: Two times a day (BID) | ORAL | 0 refills | Status: DC

## 2018-05-14 NOTE — ED Provider Notes (Signed)
[facility] ____________________________________________  Time seen: Approximately 8:45 AM  I have reviewed the triage vital signs and the nursing notes.   HISTORY  Chief Complaint Sore Throat and Otalgia   HPI Robert Rose is a 27 y.o. male presenting for evaluation of continued sore throat.  Patient reports that this past Thursday he started with runny nose, nasal congestion, cough with accompanying fever, T-max of 102.  Patient states fever only lasted on Thursday and none since.  States nasal congestion and cough have been improving, and states cough has resolved.  States this past Sunday he started with sore throat that has been constant and somewhat worsening.  States sore throat currently is mild to moderate.  Also reports having some left and right ear discomfort last night, none currently.  Continues to overall be able to eat and drink well.  Reports his eating child has had some recent cough and congestion symptoms.  Denies other known direct sick contacts.  Has occasionally taken some over-the-counter Advil, denies other over-the-counter medications.  Denies other alleviating measures.  Reports otherwise feels well. Denies chest pain, shortness of breath, abdominal pain, or rash. Denies recent sickness. Denies recent antibiotic use.  Patient also reports that he has white coat syndrome, and states that he follows with his primary care monitoring his blood pressure closely and is normal at home.  Olin Hauser, DO: PCP   Past Medical History:  Diagnosis Date  . ADD (attention deficit disorder)   . Anxiety   . 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 Low HDL (under 40) 01/11/2018  . Elevated BP without diagnosis of hypertension 12/19/2017  . Retinoblastoma ([place]) 12/19/2017    History reviewed. No pertinent surgical history.   No current facility-administered medications for this encounter.    Current Outpatient Medications:  .  ibuprofen (ADVIL,MOTRIN) 800 MG tablet, Take 1 tablet (800 mg total) by mouth 3 (three) times daily., Disp: 30 tablet, Rfl: 0 .  amoxicillin (AMOXIL) 875 MG tablet, Take 1 tablet (875 mg total) by mouth 2 (two) times daily., Disp: 20 tablet, Rfl: 0  Allergies Patient has no known allergies.  Family History  Problem Relation Age of Onset  . Hypertension Mother   . Other Mother        Congenital bicuspid aortic valve, and his cousin  . Hypertension Father   . ADD / ADHD Father 40       mild  . Retinoblastoma Brother        eye removal surgery    Social History Social History   Tobacco Use  . Smoking status: Never Smoker  . Smokeless tobacco: Never Used  Substance Use Topics  . Alcohol use: Yes  . Drug use: No    Review of Systems Constitutional: As above.  Eyes: No visual changes. ENT: AS above.  Cardiovascular: Denies chest pain. Respiratory: Denies shortness of breath. Gastrointestinal: No abdominal pain.  Musculoskeletal: Negative for back pain. Skin: Negative for rash.   ____________________________________________   PHYSICAL EXAM:  VITAL SIGNS: ED Triage Vitals  Enc Vitals Group     BP 05/14/18 0821 (!) 158/109     Pulse Rate 05/14/18 0821 100     Resp 05/14/18 0821 18     Temp 05/14/18 0821 98.5 F (36.9 C)     Temp Source 05/14/18 0821 Oral     SpO2 05/14/18 0821 100 %  Weight 05/14/18 0820 248 lb (112.5 kg)     Height 05/14/18 0820 6' (1.829 m)     Head Circumference --      Peak Flow --      Pain Score 05/14/18 0820 8     Pain Loc --      Pain Edu? --      Excl. in [place]? --    Vitals:   05/14/18 0820 05/14/18 0821 05/14/18 0845  BP:  (!) 158/109 (!) 138/100  Pulse:  100   Resp:  18   Temp:  98.5 F (36.9 C)   TempSrc:  Oral   SpO2:  100%   Weight: 248 lb (112.5 kg)    Height: 6' (1.829 m)       Constitutional: Alert and oriented. Well appearing and in no acute distress. Eyes: Conjunctivae  are normal.  Head: Atraumatic. No sinus tenderness to palpation. No swelling. No erythema.  Ears: Left nontender, total cerumen impaction, post cerumen removal, mild irritation in the canal, moderate TM erythema and dullness.  Right: Nontender, normal canal, no erythema, normal TM.  Nose:Nasal congestion  Mouth/Throat: Mucous membranes are moist.  Mild to moderate pharyngeal erythema.  Mild bilateral tonsillar swelling.  Mild bilateral tonsillar exudate, with 2 less than 0.5 centimeter exudative lesions present just superiorly to the uvula.  No other oral lesions noted. Neck: No stridor.  No cervical spine tenderness to palpation. Hematological/Lymphatic/Immunilogical: Mild anterior bilateral cervical lymphadenopathy. Cardiovascular: Normal rate, regular rhythm. Grossly normal heart sounds.  Good peripheral circulation. Respiratory: Normal respiratory effort.  No retractions. No wheezes, rales or rhonchi. Good air movement.  Musculoskeletal: Ambulatory with steady gait. No cervical, thoracic or lumbar tenderness to palpation. Neurologic:  Normal speech and language. No gait instability. Skin:  Skin appears warm, dry and intact. No rash noted. Psychiatric: Mood and affect are normal. Speech and behavior are normal.  ___________________________________________   LABS (all labs ordered are listed, but only abnormal results are displayed)  Labs Reviewed  RAPID STREP SCREEN ([facility] ONLY)  CULTURE, GROUP A STREP [facility])     PROCEDURES Procedures    INITIAL IMPRESSION / ASSESSMENT AND PLAN / ED COURSE  Pertinent labs & imaging results that were available during my care of the patient were reviewed by me and considered in my medical decision making (see chart for details).   Well-appearing patient.  No acute distress.  Suspect recent viral illness.  However pharyngeal appearance, concerning for strep, also discussed herpetic and viral infections. No other oral lesions noted. Left otitis  media. Will treat with oral amoxicillin, and encouraged very strict follow-up and return parameters.  Left cerumen impaction removed, patient tolerated well. Discussed indication, risks and benefits of medications with patient.  Also discussed to continue to monitor blood pressure and continue following up with primary care.  Discussed follow up with Primary care physician this week. Discussed follow up and return parameters including no resolution or any worsening concerns. Patient verbalized understanding and agreed to plan.   ____________________________________________   FINAL CLINICAL IMPRESSION(S) / ED DIAGNOSES  Final diagnoses:  Acute pharyngitis, unspecified etiology  Acute upper respiratory infection  Impacted cerumen of left ear  Elevated blood pressure reading  Left otitis media, unspecified otitis media type     ED Discharge Orders        Ordered    amoxicillin (AMOXIL) 875 MG tablet  2 times daily     05/14/18 0852       Note: This dictation  was prepared with Dragon dictation along with smaller phrase technology. Any transcriptional errors that result from this process are unintentional.         Marylene Land, NP 05/14/18 0900

## 2018-05-14 NOTE — ED Triage Notes (Signed)
Patient c/o sore throat and fever that started last Thursday.

## 2018-05-14 NOTE — Discharge Instructions (Addendum)
Take medication as prescribed. Rest. Drink plenty of fluids. Continue to monitor blood pressure.   Follow up with your primary care physician this week as needed. Return to Urgent care for new or worsening concerns.

## 2018-05-16 LAB — CULTURE, GROUP A STREP (THRC)

## 2018-12-15 ENCOUNTER — Telehealth: Payer: Managed Care, Other (non HMO) | Admitting: Family

## 2018-12-15 DIAGNOSIS — R05 Cough: Secondary | ICD-10-CM | POA: Diagnosis not present

## 2018-12-15 DIAGNOSIS — R059 Cough, unspecified: Secondary | ICD-10-CM

## 2018-12-15 DIAGNOSIS — J329 Chronic sinusitis, unspecified: Secondary | ICD-10-CM

## 2018-12-15 DIAGNOSIS — B9689 Other specified bacterial agents as the cause of diseases classified elsewhere: Secondary | ICD-10-CM | POA: Diagnosis not present

## 2018-12-15 MED ORDER — BENZONATATE 100 MG PO CAPS: 100.0000 mg | ORAL_CAPSULE | Freq: Three times a day (TID) | ORAL | 0 refills | Status: DC | PRN

## 2018-12-15 MED ORDER — AMOXICILLIN-POT CLAVULANATE 875-125 MG PO TABS: 1.0000 | ORAL_TABLET | Freq: Two times a day (BID) | ORAL | 0 refills | Status: AC

## 2018-12-15 NOTE — Progress Notes (Signed)
Thank you for the details you included in the comment boxes. Those details are very helpful in determining the best course of treatment for you and help us to provide the best care.  We are sorry that you are not feeling well.  Here is how we plan to help!  Based on what you have shared with me it looks like you have sinusitis.  Sinusitis is inflammation and infection in the sinus cavities of the head.  Based on your presentation I believe you most likely have Acute Bacterial Sinusitis.  This is an infection caused by bacteria and is treated with antibiotics. I have prescribed Augmentin 875mg/125mg one tablet twice daily with food, for 7 days. You may use an oral decongestant such as Mucinex D or if you have glaucoma or high blood pressure use plain Mucinex. Saline nasal spray help and can safely be used as often as needed for congestion.  If you develop worsening sinus pain, fever or notice severe headache and vision changes, or if symptoms are not better after completion of antibiotic, please schedule an appointment with a health care provider.    I have also sent Tessalon Perles 100mg, take 1-2 every 8 hours as needed for cough.    Sinus infections are not as easily transmitted as other respiratory infection, however we still recommend that you avoid close contact with loved ones, especially the very young and elderly.  Remember to wash your hands thoroughly throughout the day as this is the number one way to prevent the spread of infection!  Home Care:  Only take medications as instructed by your medical team.  Complete the entire course of an antibiotic.  Do not take these medications with alcohol.  A steam or ultrasonic humidifier can help congestion.  You can place a towel over your head and breathe in the steam from hot water coming from a faucet.  Avoid close contacts especially the very young and the elderly.  Cover your mouth when you cough or sneeze.  Always remember to wash your  hands.  Get Help Right Away If:  You develop worsening fever or sinus pain.  You develop a severe head ache or visual changes.  Your symptoms persist after you have completed your treatment plan.  Make sure you  Understand these instructions.  Will watch your condition.  Will get help right away if you are not doing well or get worse.  Your e-visit answers were reviewed by a board certified advanced clinical practitioner to complete your personal care plan.  Depending on the condition, your plan could have included both over the counter or prescription medications.  If there is a problem please reply  once you have received a response from your provider.  Your safety is important to us.  If you have drug allergies check your prescription carefully.    You can use MyChart to ask questions about today's visit, request a non-urgent call back, or ask for a work or school excuse for 24 hours related to this e-Visit. If it has been greater than 24 hours you will need to follow up with your provider, or enter a new e-Visit to address those concerns.  You will get an e-mail in the next two days asking about your experience.  I hope that your e-visit has been valuable and will speed your recovery. Thank you for using e-visits.     

## 2019-01-13 ENCOUNTER — Other Ambulatory Visit: Payer: Managed Care, Other (non HMO)

## 2019-01-16 ENCOUNTER — Encounter: Payer: Managed Care, Other (non HMO) | Admitting: Family Medicine

## 2019-07-21 LAB — NOVEL CORONAVIRUS, NAA: SARS-CoV-2, NAA: POSITIVE

## 2019-07-25 ENCOUNTER — Encounter: Payer: Self-pay | Admitting: Family Medicine

## 2019-07-25 ENCOUNTER — Ambulatory Visit (INDEPENDENT_AMBULATORY_CARE_PROVIDER_SITE_OTHER): Payer: Managed Care, Other (non HMO) | Admitting: Family Medicine

## 2019-07-25 ENCOUNTER — Other Ambulatory Visit: Payer: Self-pay

## 2019-07-25 DIAGNOSIS — U071 COVID-19: Secondary | ICD-10-CM

## 2019-07-25 DIAGNOSIS — R059 Cough, unspecified: Secondary | ICD-10-CM

## 2019-07-25 DIAGNOSIS — R05 Cough: Secondary | ICD-10-CM

## 2019-07-25 NOTE — Progress Notes (Signed)
Virtual Visit via Telephone The purpose of this virtual visit is to provide medical care while limiting exposure to the novel coronavirus (COVID19) for both patient and office staff.  Consent was obtained for phone visit:  Yes.   Answered questions that patient had about telehealth interaction:  Yes.   I discussed the limitations, risks, security and privacy concerns of performing an evaluation and management service by telephone. I also discussed with the patient that there may be a patient responsible charge related to this service. The patient expressed understanding and agreed to proceed.  Patient Location: Home Provider Location: [facility])   ---------------------------------------------------------------------- Chief Complaint  Patient presents with  . positive covid test    Friday has sinus like Sxs tested on Monday and result came back positive wants paper work for work     S: Reviewed CMA documentation. I have called patient and gathered additional HPI as follows:  COVID POSITIVE Reports that symptoms started last week with some sinus symptoms and congestion, and fever bodyache chills cough, sore throat, difficulty swallowing. Seems to come and go during day, worse at night. Then gradually improved. He still has sore throat and cough. His fever has improved, yesterday he had no Tylenol or any fever reducing medication, since then. He has taken Zinc and supplement for infection. - He was seen and tested at [facility] on 07/20/19 and tested positive, he sent copy of result and abstracted. - He has a pulse oximeter at home to measure his oxygen status, been at 99% - Also he has lung capacity test and to keep working on that - He tried Mucinex Max PRN but has stopped this now.  Patient currently out of work since 07/20/19 Denies any high risk travel to areas of current concern for [place]. Denies any known or suspected exposure to person with or  possibly with COVID19.  Admits fever now resolving, improved body aches Admits cough, sinus symptoms Denies any active fever, chills, shortness of breath, sinus pain or pressure, headache, abdominal pain, diarrhea  -------------------------------------------------------------------------- O: No physical exam performed due to remote telephone encounter.  -------------------------------------------------------------------------- A&P:  POSITIVE COVID19 Cough, fever Reassuring without dyspnea Tested positive 07/21/19 Fever now resolving, has been afebrile 36 hours without tylenol - No comorbid pulmonary conditions (asthma, COPD) or immunocompromise   1. Counseling on COVID19 infection and symptom management, supportive care and risk of worsening / follow up precautions 2. Order Tessalon perls PRN cough, sent to pharmacy 3. May use OTC cold/flu medication PRN 4. Can take Tylenol PRN but should avoid use  5. He should remain out of work from 07/20/19 through 07/29/19 - he may anticipate returning on 07/30/19 - 10 days after positive test and if fever free >3 days off tylenol and symptoms improved  Will complete leave of absence/disability form for work - submit once I receive signed copy from patient, submit next week anticipated  Return criteria reviewed contact us if any change to this plan    No orders of the defined types were placed in this encounter.   REQUIRED self quarantine to [place] - advised to avoid all exposure with others while during treatment. Should continue to quarantine for up to 10-14 days - pending resolution of symptoms - will require 10 additional day quarantine after date of positive test result.   If symptoms do not resolve or significantly improve OR if WORSENING - fever / cough - or worsening shortness of breath - then should contact  us and seek advice on next steps in treatment at home vs where/when to seek care at Urgent Care or Hospital ED for  further intervention and possible testing if indicated.  Patient verbalizes understanding with the above medical recommendations including the limitation of remote medical advice.  Specific follow-up / call-back criteria were given for patient to follow-up or seek medical care more urgently if needed.   - Time spent in direct consultation with patient on phone: 11 minutes  Nobie Putnam, [facility] 07/25/2019, 11:13 AM

## 2019-07-25 NOTE — Patient Instructions (Addendum)
Sent tessalon perls for cough  Keep on current treatment for covid  If need tylenol can take - be mindful, cannot take tylenol 3 days before return to work, need to make sure fever free  Will be able to return Weds 9/2 if fever free and improved symptoms, 10 days after last positive test result  Send [place] the paperwork with your parts complete, and I will send it back on Tuesday to [place]  Please schedule a Follow-up Appointment to: Return in about 1 week (around 08/01/2019), or if symptoms worsen or fail to improve, for covid.  If you have any other questions or concerns, please feel free to call the office or send a message through [place]. You may also schedule an earlier appointment if necessary.  Additionally, you may be receiving a survey about your experience at our office within a few days to 1 week by e-mail or mail. We value your feedback.  Nobie Putnam, DO [place]

## 2020-02-20 ENCOUNTER — Telehealth: Payer: Self-pay

## 2020-02-20 DIAGNOSIS — Z0000 Encounter for general adult medical examination without abnormal findings: Secondary | ICD-10-CM

## 2020-02-23 ENCOUNTER — Other Ambulatory Visit: Payer: Managed Care, Other (non HMO)

## 2020-02-23 ENCOUNTER — Other Ambulatory Visit: Payer: Self-pay

## 2020-02-23 NOTE — Telephone Encounter (Signed)
Signed lab orders  Nobie Putnam, [facility] 02/23/2020, 8:13 AM

## 2020-02-24 LAB — CBC WITH DIFFERENTIAL/PLATELET
Absolute Monocytes: 642 cells/uL (ref 200–950)
Basophils Absolute: 62 cells/uL (ref 0–200)
Basophils Relative: 0.9 %
Eosinophils Absolute: 200 cells/uL (ref 15–500)
Eosinophils Relative: 2.9 %
HCT: 45.2 % (ref 38.5–50.0)
Hemoglobin: 15.2 g/dL (ref 13.2–17.1)
Lymphs Abs: 2422 cells/uL (ref 850–3900)
MCH: 29.3 pg (ref 27.0–33.0)
MCHC: 33.6 g/dL (ref 32.0–36.0)
MCV: 87.1 fL (ref 80.0–100.0)
MPV: 9.6 fL (ref 7.5–12.5)
Monocytes Relative: 9.3 %
Neutro Abs: 3574 cells/uL (ref 1500–7800)
Neutrophils Relative %: 51.8 %
Platelets: 300 10*3/uL (ref 140–400)
RBC: 5.19 10*6/uL (ref 4.20–5.80)
RDW: 12.4 % (ref 11.0–15.0)
Total Lymphocyte: 35.1 %
WBC: 6.9 10*3/uL (ref 3.8–10.8)

## 2020-02-24 LAB — COMPLETE METABOLIC PANEL WITH GFR
AG Ratio: 1.6 (calc) (ref 1.0–2.5)
ALT: 21 U/L (ref 9–46)
AST: 18 U/L (ref 10–40)
Albumin: 4.7 g/dL (ref 3.6–5.1)
Alkaline phosphatase (APISO): 66 U/L (ref 36–130)
BUN: 15 mg/dL (ref 7–25)
CO2: 27 mmol/L (ref 20–32)
Calcium: 9.6 mg/dL (ref 8.6–10.3)
Chloride: 103 mmol/L (ref 98–110)
Creat: 1.06 mg/dL (ref 0.60–1.35)
GFR, Est African American: 109 mL/min/{1.73_m2} (ref >=60)
GFR, Est Non African American: 94 mL/min/{1.73_m2} (ref >=60)
Globulin: 3 g/dL (calc) (ref 1.9–3.7)
Glucose, Bld: 102 mg/dL — ABNORMAL HIGH (ref 65–99)
Potassium: 4.3 mmol/L (ref 3.5–5.3)
Sodium: 138 mmol/L (ref 135–146)
Total Bilirubin: 0.5 mg/dL (ref 0.2–1.2)
Total Protein: 7.7 g/dL (ref 6.1–8.1)

## 2020-02-24 LAB — LIPID PANEL
Cholesterol: 157 mg/dL (ref <200)
HDL: 37 mg/dL — ABNORMAL LOW (ref >=40)
LDL Cholesterol (Calc): 99 mg/dL (calc)
Non-HDL Cholesterol (Calc): 120 mg/dL (calc) (ref <130)
Total CHOL/HDL Ratio: 4.2 (calc) (ref <5.0)
Triglycerides: 109 mg/dL (ref <150)

## 2020-02-24 LAB — HEMOGLOBIN A1C
Hgb A1c MFr Bld: 5.2 % of total Hgb (ref <5.7)
Mean Plasma Glucose: 103 (calc)
eAG (mmol/L): 5.7 (calc)

## 2020-02-27 ENCOUNTER — Other Ambulatory Visit: Payer: Self-pay

## 2020-02-27 ENCOUNTER — Encounter: Payer: Self-pay | Admitting: Family Medicine

## 2020-02-27 ENCOUNTER — Ambulatory Visit (INDEPENDENT_AMBULATORY_CARE_PROVIDER_SITE_OTHER): Payer: Managed Care, Other (non HMO) | Admitting: Family Medicine

## 2020-02-27 VITALS — BP 136/90 | HR 105 | Temp 97.7°F | Ht 72.0 in | Wt 261.2 lb

## 2020-02-27 DIAGNOSIS — R030 Elevated blood-pressure reading, without diagnosis of hypertension: Secondary | ICD-10-CM

## 2020-02-27 DIAGNOSIS — C6921 Malignant neoplasm of right retina: Secondary | ICD-10-CM | POA: Diagnosis not present

## 2020-02-27 DIAGNOSIS — Z0000 Encounter for general adult medical examination without abnormal findings: Secondary | ICD-10-CM

## 2020-02-27 DIAGNOSIS — E786 Lipoprotein deficiency: Secondary | ICD-10-CM

## 2020-02-27 NOTE — Assessment & Plan Note (Signed)
Mostly controlled lipids on lifestyle. Mildly low HDL but improved Last lipid panel 01/2020  Plan: 1. Encourage improved lifestyle - low carb/cholesterol, reduce portion size, continue improving regular exercise - F/u yearly lipid

## 2020-02-27 NOTE — Assessment & Plan Note (Signed)
Still elevated BP significantly, reviewed outside readings and prior trend. 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 No complication Seems he can feel anxiety component with BP check and it is improved when he checks it on his own   Plan:  Discussion of plan to repeat home frequent BP monitoring for next 1-2 weeks, send me numbers and update on mychart we can review. - Ultimately we discussed possibility of a trial on low dose BP med such as Amlodipine 2.5 or 5 to trial see if it can stabilize his BP overall, it is difficult to discern if he is having high BP longer term or if it is only sudden reaction to measuring BP cuff w/ "white coat" - He is interested to try med at some point if we agree  Encourage improved lifestyle - low sodium diet, regular exercise Start monitor BP outside office, bring readings to next visit, if persistently >135/85 or new symptoms notify office sooner  F/u within few weeks then 6 months

## 2020-02-27 NOTE — Patient Instructions (Addendum)
Thank you for coming to the office today.  Let me know if interested in low dose BP med Amlodipine 2.5 or 5mg  daily to help keep BP stable overall we can try this whenever you are interested.  Keep track of readings, send message within next 2 weeks for BP readings.  Blood work is good  Recent Labs    02/23/20 0821  HGBA1C 5.2   I am not worried about elevated A1c trend to 5.2 we will check next time as well.   Please schedule a Follow-up Appointment to: Return in about 6 months (around 08/28/2020) for 6 month BP check.  If you have any other questions or concerns, please feel free to call the office or send a message through [place]. You may also schedule an earlier appointment if necessary.  Additionally, you may be receiving a survey about your experience at our office within a few days to 1 week by e-mail or mail. We value your feedback.  Nobie Putnam, DO [place]

## 2020-02-27 NOTE — Progress Notes (Signed)
Subjective:    Patient ID: Robert Rose, male    DOB: [birth year], 29 y.o.   MRN: [id number]  Robert Rose is a 29 y.o. male presenting on 02/27/2020 for Annual Exam   HPI   Here for Annual Physical and Lab Review  Elevated BPwithout dx HTN / White Coat HTN Known history of elevated BP in past. He has monitored home BP regularly. Interval update SBP 130-140 / 80s, now he has been getting range of 120-130s / low 80s usually related to anxiety and improves with repeat readings See prior notes for background information. - Today patient reports no new concerns, feels well, he feels more comfortable checking BP at home now Current Meds -Never on meds Lifestyle: - Diet: improving, salt limited - Exercise: increased step count, home work out videos - Substances: Used smokeless tobacco dip for few years. Caffeine intake. Denies CP, dyspnea, HA, edema, dizziness / lightheadedness  Low HDL Last labs 01/2020, showed mild low HDL still but better up to 37, otherwise normal Total Chol 157, LDL 99, and TG 109  Also he has a recent Scaphoid Fracture - has wrist splint currently.  Family history of cancer - Brother also dx with retinoblastoma and later dx with melanoma. - Family is always going to dermatology q 6 months for surveillance.  Nail Splitting Has apt upcoming with nail specialist Dr Allene Dillon at [facility] on 03/08/20 for follow-up as recommended by local dermatology  Father has history of cardiac abnormality with some possible arrhythmia   Health Maintenance: UTD TDap UTD COVID19 vaccine J&J 01/2020 Declines routine HIV screen   Depression screen [facility] 2/9 07/25/2019 01/11/2018 12/19/2017  Decreased Interest 0 0 0  Down, Depressed, Hopeless 0 0 0  PHQ - 2 Score 0 0 0    Past Medical History:  Diagnosis Date  . ADD (attention deficit disorder)   . Anxiety   . Retinoblastoma (Robert Rose)    Right eye   No past surgical history on file. Social History    Socioeconomic History  . Marital status: Married    Spouse name: Not on file  . Number of children: Not on file  . Years of education: Not on file  . Highest education level: Not on file  Occupational History  . Not on file  Tobacco Use  . Smoking status: Never Smoker  . Smokeless tobacco: Never Used  Substance and Sexual Activity  . Alcohol use: Yes  . Drug use: No  . Sexual activity: Not on file  Other Topics Concern  . Not on file  Social History Narrative  . Not on file   Social Determinants of Health   Financial Resource Strain:   . Difficulty of Paying Living Expenses:   Food Insecurity:   . Worried About Charity fundraiser in the Last Year:   . Arboriculturist in the Last Year:   Transportation Needs:   . Film/video editor (Medical):   Marland Kitchen Lack of Transportation (Non-Medical):   Physical Activity:   . Days of Exercise per Week:   . Minutes of Exercise per Session:   Stress:   . Feeling of Stress :   Social Connections:   . Frequency of Communication with Friends and Family:   . Frequency of Social Gatherings with Friends and Family:   . Attends Religious Services:   . Active Member of Clubs or Organizations:   . Attends Archivist Meetings:   Marland Kitchen Marital Status:  Intimate Partner Violence:   . Fear of Current or Ex-Partner:   . Emotionally Abused:   Marland Kitchen Physically Abused:   . Sexually Abused:    Family History  Problem Relation Age of Onset  . Hypertension Mother   . Other Mother        Congenital bicuspid aortic valve, and his cousin  . Valvular heart disease Mother        aortic bicuspid valve  . Hypertension Father   . ADD / ADHD Father 40       mild  . Melanoma Father   . Retinoblastoma Brother        eye removal surgery  . Melanoma Brother   . Colon cancer Neg Hx   . Prostate cancer Neg Hx    No current outpatient medications on file prior to visit.   No current facility-administered medications on file prior to visit.     Review of Systems  Constitutional: Negative for activity change, appetite change, chills, diaphoresis, fatigue and fever.  HENT: Negative for congestion and hearing loss.   Eyes: Negative for visual disturbance.  Respiratory: Negative for apnea, cough, chest tightness, shortness of breath and wheezing.   Cardiovascular: Negative for chest pain, palpitations and leg swelling.  Gastrointestinal: Negative for abdominal pain, anal bleeding, blood in stool, constipation, diarrhea, nausea and vomiting.  Endocrine: Negative for cold intolerance.  Genitourinary: Negative for decreased urine volume, dysuria, frequency, hematuria and urgency.  Musculoskeletal: Negative for arthralgias, back pain and neck pain.  Skin: Negative for rash.  Allergic/Immunologic: Negative for environmental allergies.  Neurological: Negative for dizziness, weakness, light-headedness, numbness and headaches.  Hematological: Negative for adenopathy.  Psychiatric/Behavioral: Negative for behavioral problems, dysphoric mood and sleep disturbance. The patient is not nervous/anxious.    Per HPI unless specifically indicated above      Objective:    BP 136/90 (BP Location: Left Arm, Cuff Size: Normal)   Pulse (!) 105   Temp 97.7 F (36.5 C) (Temporal)   Ht 6' (1.829 m)   Wt 261 lb 3.2 oz (118.5 kg)   SpO2 100%   BMI 35.43 kg/m   Wt Readings from Last 3 Encounters:  02/27/20 261 lb 3.2 oz (118.5 kg)  05/14/18 248 lb (112.5 kg)  01/11/18 247 lb (112 kg)    Physical Exam Vitals and nursing note reviewed.  Constitutional:      General: He is not in acute distress.    Appearance: He is well-developed. He is not diaphoretic.     Comments: Well-appearing, comfortable, cooperative  HENT:     Head: Normocephalic and atraumatic.  Eyes:     General:        Right eye: No discharge.        Left eye: No discharge.     Conjunctiva/sclera: Conjunctivae normal.     Pupils: Pupils are equal, round, and reactive to  light.  Neck:     Thyroid: No thyromegaly.  Cardiovascular:     Rate and Rhythm: Normal rate and regular rhythm.     Heart sounds: Normal heart sounds. No murmur.  Pulmonary:     Effort: Pulmonary effort is normal. No respiratory distress.     Breath sounds: Normal breath sounds. No wheezing or rales.  Abdominal:     General: Bowel sounds are normal. There is no distension.     Palpations: Abdomen is soft. There is no mass.     Tenderness: There is no abdominal tenderness.  Musculoskeletal:  General: No tenderness. Normal range of motion.     Cervical back: Normal range of motion and neck supple.     Comments: Upper / Lower Extremities: - Normal muscle tone, strength bilateral upper extremities 5/5, lower extremities 5/5  Left wrist in splint/support  Lymphadenopathy:     Cervical: No cervical adenopathy.  Skin:    General: Skin is warm and dry.     Findings: No erythema or rash.     Comments: Nail splitting multiple nails  Neurological:     Mental Status: He is alert and oriented to person, place, and time.     Comments: Distal sensation intact to light touch all extremities  Psychiatric:        Behavior: Behavior normal.     Comments: Well groomed, good eye contact, normal speech and thoughts        Results for orders placed or performed in visit on 02/20/20  COMPLETE METABOLIC PANEL WITH GFR  Result Value Ref Range   Glucose, Bld 102 (H) 65 - 99 mg/dL   BUN 15 7 - 25 mg/dL   Creat 1.06 0.60 - 1.35 mg/dL   GFR, Est Non African American 94 > OR = 60 mL/min/1.18m2   GFR, Est African American 109 > OR = 60 mL/min/1.22m2   BUN/Creatinine Ratio NOT APPLICABLE 6 - 22 (calc)   Sodium 138 135 - 146 mmol/L   Potassium 4.3 3.5 - 5.3 mmol/L   Chloride 103 98 - 110 mmol/L   CO2 27 20 - 32 mmol/L   Calcium 9.6 8.6 - 10.3 mg/dL   Total Protein 7.7 6.1 - 8.1 g/dL   Albumin 4.7 3.6 - 5.1 g/dL   Globulin 3.0 1.9 - 3.7 g/dL (calc)   AG Ratio 1.6 1.0 - 2.5 (calc)   Total  Bilirubin 0.5 0.2 - 1.2 mg/dL   Alkaline phosphatase (APISO) 66 36 - 130 U/L   AST 18 10 - 40 U/L   ALT 21 9 - 46 U/L  Hemoglobin A1c  Result Value Ref Range   Hgb A1c MFr Bld 5.2 <5.7 % of total Hgb   Mean Plasma Glucose 103 (calc)   eAG (mmol/L) 5.7 (calc)  Lipid panel  Result Value Ref Range   Cholesterol 157 <200 mg/dL   HDL 37 (L) > OR = 40 mg/dL   Triglycerides 109 <150 mg/dL   LDL Cholesterol (Calc) 99 mg/dL (calc)   Total CHOL/HDL Ratio 4.2 <5.0 (calc)   Non-HDL Cholesterol (Calc) 120 <130 mg/dL (calc)  CBC with Differential/Platelet  Result Value Ref Range   WBC 6.9 3.8 - 10.8 Thousand/uL   RBC 5.19 4.20 - 5.80 Million/uL   Hemoglobin 15.2 13.2 - 17.1 g/dL   HCT 45.2 38.5 - 50.0 %   MCV 87.1 80.0 - 100.0 fL   MCH 29.3 27.0 - 33.0 pg   MCHC 33.6 32.0 - 36.0 g/dL   RDW 12.4 11.0 - 15.0 %   Platelets 300 140 - 400 Thousand/uL   MPV 9.6 7.5 - 12.5 fL   Neutro Abs 3,574 1,500 - 7,800 cells/uL   Lymphs Abs 2,422 850 - 3,900 cells/uL   Absolute Monocytes 642 200 - 950 cells/uL   Eosinophils Absolute 200 15 - 500 cells/uL   Basophils Absolute 62 0 - 200 cells/uL   Neutrophils Relative % 51.8 %   Total Lymphocyte 35.1 %   Monocytes Relative 9.3 %   Eosinophils Relative 2.9 %   Basophils Relative 0.9 %  Assessment & Plan:   Problem List Items Addressed This Visit    Retinoblastoma ([place])    Stable >20+ years without recurrence or complication S/p radiation therapy soon after dx at young age 55 Fam history with brother      Low HDL (under 40)    Mostly controlled lipids on lifestyle. Mildly low HDL but improved Last lipid panel 01/2020  Plan: 1. Encourage improved lifestyle - low carb/cholesterol, reduce portion size, continue improving regular exercise - F/u yearly lipid      Elevated BP without diagnosis of hypertension    Still elevated BP significantly, reviewed outside readings and prior trend. 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 No complication Seems he can feel anxiety component with BP check and it is improved when he checks it on his own   Plan:  Discussion of plan to repeat home frequent BP monitoring for next 1-2 weeks, send me numbers and update on mychart we can review. - Ultimately we discussed possibility of a trial on low dose BP med such as Amlodipine 2.5 or 5 to trial see if it can stabilize his BP overall, it is difficult to discern if he is having high BP longer term or if it is only sudden reaction to measuring BP cuff w/ "white coat" - He is interested to try med at some point if we agree  Encourage improved lifestyle - low sodium diet, regular exercise Start monitor BP outside office, bring readings to next visit, if persistently >135/85 or new symptoms notify office sooner  F/u within few weeks then 6 months       Other Visit Diagnoses    Annual physical exam    -  Primary      Updated Health Maintenance information Reviewed recent lab results with patient Encouraged improvement to lifestyle with diet and exercise - Goal of weight loss  F/u w [facility]ology specialty as scheduled  No orders of the defined types were placed in this encounter.     Follow up plan: Return in about 6 months (around 08/28/2020) for 6 month BP check.  Nobie Putnam, DO [facility] 02/27/2020, 9:40 AM

## 2020-02-27 NOTE — Assessment & Plan Note (Signed)
Stable >20+ years without recurrence or complication ?S/p radiation therapy soon after dx at young age 29 ?Fam history with brother ?

## 2021-02-21 ENCOUNTER — Other Ambulatory Visit: Payer: Self-pay | Admitting: Family Medicine

## 2021-02-21 DIAGNOSIS — Z0000 Encounter for general adult medical examination without abnormal findings: Secondary | ICD-10-CM

## 2021-02-21 DIAGNOSIS — C6921 Malignant neoplasm of right retina: Secondary | ICD-10-CM

## 2021-02-21 DIAGNOSIS — R7309 Other abnormal glucose: Secondary | ICD-10-CM

## 2021-02-21 DIAGNOSIS — E786 Lipoprotein deficiency: Secondary | ICD-10-CM

## 2021-02-21 DIAGNOSIS — R030 Elevated blood-pressure reading, without diagnosis of hypertension: Secondary | ICD-10-CM

## 2021-03-01 ENCOUNTER — Other Ambulatory Visit: Payer: Managed Care, Other (non HMO)

## 2021-03-08 ENCOUNTER — Other Ambulatory Visit: Payer: Self-pay

## 2021-03-08 ENCOUNTER — Encounter: Payer: Managed Care, Other (non HMO) | Admitting: Family Medicine

## 2021-03-08 ENCOUNTER — Other Ambulatory Visit: Payer: Managed Care, Other (non HMO)

## 2021-03-08 DIAGNOSIS — E786 Lipoprotein deficiency: Secondary | ICD-10-CM

## 2021-03-08 DIAGNOSIS — R7309 Other abnormal glucose: Secondary | ICD-10-CM

## 2021-03-08 DIAGNOSIS — C6921 Malignant neoplasm of right retina: Secondary | ICD-10-CM

## 2021-03-08 DIAGNOSIS — R030 Elevated blood-pressure reading, without diagnosis of hypertension: Secondary | ICD-10-CM

## 2021-03-08 DIAGNOSIS — Z0000 Encounter for general adult medical examination without abnormal findings: Secondary | ICD-10-CM

## 2021-03-08 LAB — CBC WITH DIFFERENTIAL/PLATELET
Basophils Absolute: 59 cells/uL (ref 0–200)
MCHC: 32.7 g/dL (ref 32.0–36.0)
Monocytes Relative: 9.3 %
Neutro Abs: 3643 cells/uL (ref 1500–7800)
Platelets: 265 10*3/uL (ref 140–400)
RBC: 5.31 10*6/uL (ref 4.20–5.80)

## 2021-03-09 LAB — HEMOGLOBIN A1C
Hgb A1c MFr Bld: 5.3 % of total Hgb (ref <5.7)
Mean Plasma Glucose: 105 mg/dL
eAG (mmol/L): 5.8 mmol/L

## 2021-03-09 LAB — COMPLETE METABOLIC PANEL WITH GFR
AG Ratio: 1.9 (calc) (ref 1.0–2.5)
ALT: 30 U/L (ref 9–46)
AST: 22 U/L (ref 10–40)
Albumin: 4.8 g/dL (ref 3.6–5.1)
Alkaline phosphatase (APISO): 68 U/L (ref 36–130)
BUN: 18 mg/dL (ref 7–25)
CO2: 26 mmol/L (ref 20–32)
Calcium: 9.4 mg/dL (ref 8.6–10.3)
Chloride: 104 mmol/L (ref 98–110)
Creat: 0.96 mg/dL (ref 0.60–1.35)
GFR, Est African American: 122 mL/min/{1.73_m2} (ref >=60)
GFR, Est Non African American: 105 mL/min/{1.73_m2} (ref >=60)
Globulin: 2.5 g/dL (calc) (ref 1.9–3.7)
Glucose, Bld: 103 mg/dL — ABNORMAL HIGH (ref 65–99)
Potassium: 4.1 mmol/L (ref 3.5–5.3)
Sodium: 138 mmol/L (ref 135–146)
Total Bilirubin: 0.5 mg/dL (ref 0.2–1.2)
Total Protein: 7.3 g/dL (ref 6.1–8.1)

## 2021-03-09 LAB — LIPID PANEL
Cholesterol: 181 mg/dL (ref <200)
HDL: 46 mg/dL (ref >=40)
LDL Cholesterol (Calc): 115 mg/dL (calc) — ABNORMAL HIGH
Non-HDL Cholesterol (Calc): 135 mg/dL (calc) — ABNORMAL HIGH (ref <130)
Total CHOL/HDL Ratio: 3.9 (calc) (ref <5.0)
Triglycerides: 101 mg/dL (ref <150)

## 2021-03-09 LAB — CBC WITH DIFFERENTIAL/PLATELET
Absolute Monocytes: 614 cells/uL (ref 200–950)
Basophils Relative: 0.9 %
Eosinophils Absolute: 290 cells/uL (ref 15–500)
Eosinophils Relative: 4.4 %
HCT: 46.5 % (ref 38.5–50.0)
Hemoglobin: 15.2 g/dL (ref 13.2–17.1)
Lymphs Abs: 1993 cells/uL (ref 850–3900)
MCH: 28.6 pg (ref 27.0–33.0)
MCV: 87.6 fL (ref 80.0–100.0)
MPV: 9.3 fL (ref 7.5–12.5)
Neutrophils Relative %: 55.2 %
RDW: 12.7 % (ref 11.0–15.0)
Total Lymphocyte: 30.2 %
WBC: 6.6 10*3/uL (ref 3.8–10.8)

## 2021-03-15 ENCOUNTER — Other Ambulatory Visit: Payer: Self-pay

## 2021-03-15 ENCOUNTER — Ambulatory Visit (INDEPENDENT_AMBULATORY_CARE_PROVIDER_SITE_OTHER): Payer: Managed Care, Other (non HMO) | Admitting: Family Medicine

## 2021-03-15 ENCOUNTER — Encounter: Payer: Self-pay | Admitting: Family Medicine

## 2021-03-15 VITALS — BP 140/96 | HR 109 | Temp 97.5°F | Ht 72.0 in | Wt 272.4 lb

## 2021-03-15 DIAGNOSIS — Z0000 Encounter for general adult medical examination without abnormal findings: Secondary | ICD-10-CM

## 2021-03-15 NOTE — Progress Notes (Signed)
Subjective:    Patient ID: Robert Rose, male    DOB: 1991/06/18, 30 y.o.   MRN: [id number]  Robert Rose is a 30 y.o. male presenting on 03/15/2021 for Annual Exam   HPI  Here for Annual Physical and Lab Review  Additional history today with life stressor His daughter is age 50 months, and she has Retinoblastoma and is being treated with chemo at [facility]  Hypertension, Susepcted New diagnosis History of Elevated BPwithout dx HTN / White Coat HTN Known history of elevated BP in past. He has monitored home BP regularly. Home BP readings have been elevated occasionally - 140/90s at times, sometimes lower. See prior notes for background information. Current Meds -Never on meds Lifestyle: Weight up 11 lbs in 1 year. - Diet: poor diet - Exercise: Recent change of job he is doing better mentally, less stress, home more, he has less steps at work however, less active - Substances: Used smokeless tobacco dip for few years. Caffeine intake. Denies CP, dyspnea, HA, edema, dizziness / lightheadedness  Elevated LDL Last labs 02/2021, LDL at 115, had elevated from previous  Family history of cancer - Brother also dx with retinoblastoma and later dx with melanoma. - Family is always going to dermatology q 6 months for surveillance.  Nail Splitting / Longitudinal Erythronychia  Has seen [facility], identified this benign issue for nail splitting. No sign of melanoma.   Father has history of cardiac abnormality with some possible arrhythmia   Health Maintenance: UTD TDap UTD COVID19 vaccine J&J 01/2020 Declines routine HIV screen  Depression screen [facility] 2/9 03/15/2021 07/25/2019 01/11/2018  Decreased Interest 0 0 0  Down, Depressed, Hopeless 0 0 0  PHQ - 2 Score 0 0 0    Past Medical History:  Diagnosis Date  . ADD (attention deficit disorder)   . Anxiety   . 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 No pertinent surgical history. Social History    Socioeconomic History  . Marital status: Married    Spouse name: Not on file  . Number of children: Not on file  . Years of education: Not on file  . Highest education level: Not on file  Occupational History  . Not on file  Tobacco Use  . Smoking status: Never Smoker  . Smokeless tobacco: Never Used  Substance and Sexual Activity  . Alcohol use: Yes  . Drug use: No  . Sexual activity: Not on file  Other Topics Concern  . Not on file  Social History Narrative  . Not on file   Social Determinants of Health   Financial Resource Strain: Not on file  Food Insecurity: Not on file  Transportation Needs: Not on file  Physical Activity: Not on file  Stress: Not on file  Social Connections: Not on file  Intimate Partner Violence: Not on file   Family History  Problem Relation Age of Onset  . Hypertension Mother   . Other Mother        Congenital bicuspid aortic valve, and his cousin  . Valvular heart disease Mother        aortic bicuspid valve  . Hypertension Father   . ADD / ADHD Father 40       mild  . Melanoma Father   . Retinoblastoma Brother        eye removal surgery  . Melanoma Brother   . Colon cancer Neg Hx   . Prostate cancer Neg Hx  No current outpatient medications on file prior to visit.   No current facility-administered medications on file prior to visit.    Review of Systems  Constitutional: Negative for activity change, appetite change, chills, diaphoresis, fatigue and fever.  HENT: Negative for congestion and hearing loss.   Eyes: Negative for visual disturbance.  Respiratory: Negative for apnea, cough, chest tightness, shortness of breath and wheezing.   Cardiovascular: Negative for chest pain, palpitations and leg swelling.  Gastrointestinal: Negative for abdominal pain, anal bleeding, blood in stool, constipation, diarrhea, nausea and vomiting.  Endocrine: Negative for cold intolerance.  Genitourinary: Negative for difficulty urinating,  dysuria, frequency and hematuria.  Musculoskeletal: Negative for arthralgias and neck pain.  Skin: Negative for rash.  Allergic/Immunologic: Negative for environmental allergies.  Neurological: Negative for dizziness, weakness, light-headedness, numbness and headaches.  Hematological: Negative for adenopathy.  Psychiatric/Behavioral: Negative for behavioral problems, dysphoric mood and sleep disturbance. The patient is not nervous/anxious.    Per HPI unless specifically indicated above      Objective:    BP (!) 140/96 (BP Location: Left Arm, Cuff Size: Normal)   Pulse (!) 109   Temp (!) 97.5 F (36.4 C) (Temporal)   Ht 6' (1.829 m)   Wt 272 lb 6.4 oz (123.6 kg)   SpO2 100%   BMI 36.94 kg/m   Wt Readings from Last 3 Encounters:  03/15/21 272 lb 6.4 oz (123.6 kg)  02/27/20 261 lb 3.2 oz (118.5 kg)  05/14/18 248 lb (112.5 kg)    Physical Exam Vitals and nursing note reviewed.  Constitutional:      General: He is not in acute distress.    Appearance: He is well-developed. He is obese. He is not diaphoretic.     Comments: Well-appearing, comfortable, cooperative  HENT:     Head: Normocephalic and atraumatic.  Eyes:     General:        Right eye: No discharge.        Left eye: No discharge.     Conjunctiva/sclera: Conjunctivae normal.     Pupils: Pupils are equal, round, and reactive to light.  Neck:     Thyroid: No thyromegaly.     Vascular: No carotid bruit.  Cardiovascular:     Rate and Rhythm: Normal rate and regular rhythm.     Heart sounds: Normal heart sounds. No murmur heard.   Pulmonary:     Effort: Pulmonary effort is normal. No respiratory distress.     Breath sounds: Normal breath sounds. No wheezing or rales.  Abdominal:     General: Bowel sounds are normal. There is no distension.     Palpations: Abdomen is soft. There is no mass.     Tenderness: There is no abdominal tenderness.  Musculoskeletal:        General: No tenderness. Normal range of motion.      Cervical back: Normal range of motion and neck supple.     Right lower leg: No edema.     Left lower leg: No edema.     Comments: Upper / Lower Extremities: - Normal muscle tone, strength bilateral upper extremities 5/5, lower extremities 5/5  Lymphadenopathy:     Cervical: No cervical adenopathy.  Skin:    General: Skin is warm and dry.     Findings: No erythema or rash.  Neurological:     Mental Status: He is alert and oriented to person, place, and time.     Comments: Distal sensation intact to light touch all extremities  Psychiatric:        Behavior: Behavior normal.     Comments: Well groomed, good eye contact, normal speech and thoughts      Results for orders placed or performed in visit on 03/08/21  Lipid panel  Result Value Ref Range   Cholesterol 181 <200 mg/dL   HDL 46 > OR = 40 mg/dL   Triglycerides 101 <150 mg/dL   LDL Cholesterol (Calc) 115 (H) mg/dL (calc)   Total CHOL/HDL Ratio 3.9 <5.0 (calc)   Non-HDL Cholesterol (Calc) 135 (H) <130 mg/dL (calc)  COMPLETE METABOLIC PANEL WITH GFR  Result Value Ref Range   Glucose, Bld 103 (H) 65 - 99 mg/dL   BUN 18 7 - 25 mg/dL   Creat 0.96 0.60 - 1.35 mg/dL   GFR, Est Non African American 105 > OR = 60 mL/min/1.32m2   GFR, Est African American 122 > OR = 60 mL/min/1.55m2   BUN/Creatinine Ratio NOT APPLICABLE 6 - 22 (calc)   Sodium 138 135 - 146 mmol/L   Potassium 4.1 3.5 - 5.3 mmol/L   Chloride 104 98 - 110 mmol/L   CO2 26 20 - 32 mmol/L   Calcium 9.4 8.6 - 10.3 mg/dL   Total Protein 7.3 6.1 - 8.1 g/dL   Albumin 4.8 3.6 - 5.1 g/dL   Globulin 2.5 1.9 - 3.7 g/dL (calc)   AG Ratio 1.9 1.0 - 2.5 (calc)   Total Bilirubin 0.5 0.2 - 1.2 mg/dL   Alkaline phosphatase (APISO) 68 36 - 130 U/L   AST 22 10 - 40 U/L   ALT 30 9 - 46 U/L  CBC with Differential/Platelet  Result Value Ref Range   WBC 6.6 3.8 - 10.8 Thousand/uL   RBC 5.31 4.20 - 5.80 Million/uL   Hemoglobin 15.2 13.2 - 17.1 g/dL   HCT 46.5 38.5 - 50.0 %    MCV 87.6 80.0 - 100.0 fL   MCH 28.6 27.0 - 33.0 pg   MCHC 32.7 32.0 - 36.0 g/dL   RDW 12.7 11.0 - 15.0 %   Platelets 265 140 - 400 Thousand/uL   MPV 9.3 7.5 - 12.5 fL   Neutro Abs 3,643 1,500 - 7,800 cells/uL   Lymphs Abs 1,993 850 - 3,900 cells/uL   Absolute Monocytes 614 200 - 950 cells/uL   Eosinophils Absolute 290 15 - 500 cells/uL   Basophils Absolute 59 0 - 200 cells/uL   Neutrophils Relative % 55.2 %   Total Lymphocyte 30.2 %   Monocytes Relative 9.3 %   Eosinophils Relative 4.4 %   Basophils Relative 0.9 %  Hemoglobin A1c  Result Value Ref Range   Hgb A1c MFr Bld 5.3 <5.7 % of total Hgb   Mean Plasma Glucose 105 mg/dL   eAG (mmol/L) 5.8 mmol/L      Assessment & Plan:   Problem List Items Addressed This Visit   None   Visit Diagnoses    Annual physical exam    -  Primary      Updated Health Maintenance information UTD COVID vaccine J&J updated Reviewed recent lab results with patient Encouraged improvement to lifestyle with diet and exercise LDL mildly elevated but not in range of concern. No treatment required - goal is for lifestyle. Goal of weight loss  Elevated BP Life stressors, wt gain, diet / lifestyle more sedentary impacting him Concern for new diagnosis of Hypertension Goal BP < 135/85 He will monitor BP at home next few days and check with family on which  meds they take Offered to start new anti HTN therapy for him in near future if requested, we can consider Amlodipine or other meds, reviewed benefit risk side effects, and can order med if needed - then follow-up within 3 months, may warrant repeat chemistry if ACEi/ARB or Thiazide in 4 weeks after start med - as option.   No orders of the defined types were placed in this encounter.    Follow up plan: Return in about 3 months (around 06/14/2021) for 3 month follow-up HTN BP follow-up.  Nobie Putnam, [facility] 03/15/2021, 2:56 PM

## 2021-03-15 NOTE — Patient Instructions (Addendum)
Thank you for coming to the office today.  Please check with family members with their BP medications names and dosages and try to find out what helps them and we can help identify a good medication for you for blood pressure.  The most common medications for BP would be: - Amlodipine - can cause some benign swelling at times of legs/ankles - Lisinopril or Losartan (or other versions of this category) - may need a blood test in 4 weeks if we start this one - Hydrochlorothiazide (HCTZ)  Keep track of BP once you start new medication in future if we order it.  Goal is < 135/85   Please schedule a Follow-up Appointment to: Return in about 3 months (around 06/14/2021) for 3 month follow-up HTN BP follow-up.  If you have any other questions or concerns, please feel free to call the office or send a message through [place]. You may also schedule an earlier appointment if necessary.  Additionally, you may be receiving a survey about your experience at our office within a few days to 1 week by e-mail or mail. We value your feedback.  Nobie Putnam, DO [place]

## 2021-04-15 DIAGNOSIS — I10 Essential (primary) hypertension: Secondary | ICD-10-CM | POA: Insufficient documentation

## 2021-04-15 MED ORDER — LOSARTAN POTASSIUM 50 MG PO TABS: 50.0000 mg | ORAL_TABLET | Freq: Every day | ORAL | 2 refills | Status: DC

## 2021-07-14 ENCOUNTER — Other Ambulatory Visit: Payer: Self-pay | Admitting: Family Medicine

## 2021-07-14 DIAGNOSIS — I10 Essential (primary) hypertension: Secondary | ICD-10-CM

## 2021-07-14 NOTE — Telephone Encounter (Signed)
Requested Prescriptions  Pending Prescriptions Disp Refills  . losartan (COZAAR) 50 MG tablet [Pharmacy Med Name: LOSARTAN POTASSIUM 50 MG TAB] 90 tablet 0    Sig: TAKE 1 TABLET BY MOUTH EVERY DAY     Cardiovascular:  Angiotensin Receptor Blockers Failed - 07/14/2021  1:23 AM      Failed - Last BP in normal range    BP Readings from Last 1 Encounters:  03/15/21 (!) 140/96         Passed - Cr in normal range and within 180 days    Creat  Date Value Ref Range Status  03/08/2021 0.96 0.60 - 1.35 mg/dL Final         Passed - K in normal range and within 180 days    Potassium  Date Value Ref Range Status  03/08/2021 4.1 3.5 - 5.3 mmol/L Final         Passed - Patient is not pregnant      Passed - Valid encounter within last 6 months    Recent Outpatient Visits          4 months ago Annual physical exam   [place], DO   1 year ago Annual physical exam   [facility] Olin Hauser, DO   1 year ago COVID-19 virus infection   [place], DO   3 years ago Annual physical exam   [place], DO   3 years ago Elevated BP without diagnosis of hypertension   [place], Devonne Doughty, DO

## 2021-08-11 ENCOUNTER — Other Ambulatory Visit: Payer: Self-pay | Admitting: Family Medicine

## 2021-08-11 DIAGNOSIS — I10 Essential (primary) hypertension: Secondary | ICD-10-CM

## 2021-08-12 MED ORDER — LOSARTAN POTASSIUM 50 MG PO TABS: 50.0000 mg | ORAL_TABLET | Freq: Every day | ORAL | 1 refills | Status: DC

## 2021-12-12 ENCOUNTER — Ambulatory Visit
Admission: EM | Admit: 2021-12-12 | Discharge: 2021-12-12 | Disposition: A | Discharge status: Home / Self Care | Payer: Managed Care, Other (non HMO) | Attending: Physician Assistant | Admitting: Physician Assistant

## 2021-12-12 ENCOUNTER — Encounter: Payer: Self-pay | Admitting: Emergency Medicine

## 2021-12-12 DIAGNOSIS — J209 Acute bronchitis, unspecified: Secondary | ICD-10-CM | POA: Diagnosis not present

## 2021-12-12 MED ORDER — PREDNISONE 50 MG PO TABS: ORAL_TABLET | ORAL | 0 refills | Status: DC

## 2021-12-12 NOTE — ED Triage Notes (Signed)
Pt here with subacute cough following flu around christmas. Pt started to feel better, then about 4 days ago developed new congestion and the cough got worse. Pt is unable to sleep well and has a very swollen feeling sore throat in the mornings.

## 2021-12-12 NOTE — ED Provider Notes (Signed)
Robert Rose    CSN: [id number] Arrival date & time: 12/12/21  1024      History   Chief Complaint Chief Complaint  Patient presents with   Cough   Nasal Congestion   Sore Throat    HPI Robert Rose is a 31 y.o. male.   Patient reports he had influenza at Christmas.  Patient reports he felt better after couple days but has continued to have a cough patient reports over the last 4 days the cough has become worse he has not had a fever or chills patient denies earache or sore throat. he is not currently short of breath.  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 No pertinent surgical history.     Home Medications    Prior to Admission medications   Medication Sig Start Date End Date Taking? Authorizing Provider  predniSONE (DELTASONE) 50 MG tablet One tablet a day 12/12/21  Yes Fransico Meadow, PA-C  losartan (COZAAR) 50 MG tablet Take 1 tablet (50 mg total) by mouth daily. 08/12/21   Olin Hauser, DO    Family History Family History  Problem Relation Age of Onset   Hypertension Mother    Other Mother        Congenital bicuspid aortic valve, and his cousin   Valvular heart disease Mother        aortic bicuspid valve   Hypertension Father    ADD / ADHD Father 86       mild   Melanoma Father    Retinoblastoma Brother        eye removal surgery   Melanoma Brother    Colon cancer Neg Hx     Prostate cancer Neg Hx     Social History Social History   Tobacco Use   Smoking status: Never   Smokeless tobacco: Never  Substance Use Topics   Alcohol use: Yes   Drug use: No     Allergies   Patient has no known allergies.   Review of Systems Review of Systems  Respiratory:  Positive for cough.   All other systems reviewed and are negative.   Physical Exam Triage Vital Signs ED Triage Vitals  Enc Vitals Group     BP 12/12/21 1137 (!) 146/97     Pulse Rate 12/12/21 1137 100     Resp 12/12/21 1137 18     Temp 12/12/21 1137 98.5 F (36.9 C)     Temp Source 12/12/21 1137 Oral     SpO2 12/12/21 1137 97 %     Weight --      Height --      Head Circumference --      Peak Flow --      Pain Score 12/12/21 1144 2     Pain Loc --      Pain Edu? --  Excl. in GC? --    No data found.  Updated Vital Signs BP (!) 146/97 (BP Location: Left Arm)    Pulse 100    Temp 98.5 F (36.9 C) (Oral)    Resp 18    SpO2 97%   Visual Acuity Right Eye Distance:   Left Eye Distance:   Bilateral Distance:    Right Eye Near:   Left Eye Near:    Bilateral Near:     Physical Exam Vitals and nursing note reviewed.  Constitutional:      Appearance: He is well-developed.  HENT:     Head: Normocephalic.     Mouth/Throat:     Mouth: Mucous membranes are moist.  Eyes:     Conjunctiva/sclera: Conjunctivae normal.  Cardiovascular:     Rate and Rhythm: Normal rate.  Pulmonary:     Effort: Pulmonary effort is normal.     Breath sounds: Normal breath sounds.  Musculoskeletal:        General: Normal range of motion.     Cervical back: Normal range of motion.  Skin:    General: Skin is warm.  Neurological:     General: No focal deficit present.     Mental Status: He is alert and oriented to person, place, and time.  Psychiatric:        Mood and Affect: Mood normal.     UC Treatments / Results  Labs (all labs ordered are listed, but only abnormal results are  displayed) Labs Reviewed - No data to display  EKG   Radiology No results found.  Procedures Procedures (including critical care time)  Medications Ordered in UC Medications - No data to display  Initial Impression / Assessment and Plan / UC Course  I have reviewed the triage vital signs and the nursing notes.  Pertinent labs & imaging results that were available during my care of the patient were reviewed by me and considered in my medical decision making (see chart for details).     MDM: Patient's lungs sound good his throat is clear I do not see any signs of recurrent infection.  I suspect patient has post viral cough I will treat him with prednisone 50 mg for 6 days patient is advised to return if symptoms worsen or change Final Clinical Impressions(s) / UC Diagnoses   Final diagnoses:  Acute bronchitis, unspecified organism   Discharge Instructions   None    ED Prescriptions     Medication Sig Dispense Auth. Provider   predniSONE (DELTASONE) 50 MG tablet One tablet a day 6 tablet Fransico Meadow, [place]      PDMP not reviewed this encounter. An After Visit Summary was printed and given to the patient.    Fransico Meadow, [place] 12/12/21 1339

## 2022-03-20 ENCOUNTER — Telehealth: Payer: Self-pay | Admitting: Family Medicine

## 2022-03-20 NOTE — Telephone Encounter (Signed)
Pt is calling to ask if he can have a CPE prior to the end of the month. ?Pt was offered CPE on 03/28/22. Pt reports that he needs CPE for his employer before the end of the month. ?[phone] ?

## 2022-03-22 ENCOUNTER — Ambulatory Visit (INDEPENDENT_AMBULATORY_CARE_PROVIDER_SITE_OTHER): Payer: Managed Care, Other (non HMO) | Admitting: Family Medicine

## 2022-03-22 ENCOUNTER — Encounter: Payer: Self-pay | Admitting: Family Medicine

## 2022-03-22 VITALS — BP 138/94 | HR 85 | Ht 72.0 in | Wt 267.6 lb

## 2022-03-22 DIAGNOSIS — Z0000 Encounter for general adult medical examination without abnormal findings: Secondary | ICD-10-CM | POA: Diagnosis not present

## 2022-03-22 DIAGNOSIS — C6921 Malignant neoplasm of right retina: Secondary | ICD-10-CM

## 2022-03-22 DIAGNOSIS — E786 Lipoprotein deficiency: Secondary | ICD-10-CM | POA: Diagnosis not present

## 2022-03-22 DIAGNOSIS — I10 Essential (primary) hypertension: Secondary | ICD-10-CM | POA: Diagnosis not present

## 2022-03-22 DIAGNOSIS — R7309 Other abnormal glucose: Secondary | ICD-10-CM

## 2022-03-22 NOTE — Patient Instructions (Addendum)
Thank you for coming to the office today. ? ?I think you are doing well on the Losartan, no change now. But we will work on figuring out other sources of the blood pressure and weight is likely related ? ?You may have Sleep Apnea - you would need a new apt in future to focus on this and place referral. ? ?We may use in future: ? ?[facility] ?Address: [address], [place], [place] [address] ?Phone: [phone] ? ?If we do refer - they should call you to schedule initial sleep study, likely a home test and then if abnormal - they will arrange the 2nd part of the sleep study in the lab with the CPAP machine. ? ?If there is any issue with this company, for example not covered by insurance or other problem, please notify us and they may do so a well - we will need to change the location of the referral. ? ?-------------------------- ? ? ?Consider weight loss medication as discussed ? ?Call insurance find cost and coverage of the following - check the following: ?- Drug Tier, Preferred List, On Formulary ?- All will require a "Prior Authorization" from [place] first, before you can find out the cost ?- Find out if there is "Step Therapy" (other medicines required before you can try these) ? ?Once you pick the one you want to try, let me know - we can get a sample ready IF we have it in stock. Then try it - and before running out of medicine, contact me back to order your Rx so we have time to get it processed. ? ? ?For Weight Loss / Obesity only ? ?Wegovy (same as Ozempic) weekly injection - start 0.'25mg'$  weekly, 1 dose per pen, single use, auto-injector ? ?2. Saxenda - DAILY injection - start 0.'6mg'$  injection DAILY, sample is 3 weeks, new needle each dose. ? ? ? ?Please schedule a Follow-up Appointment to: Return in about 2 days (around 03/24/2022) for Return 2 days Fri 4/28 at 815am for fasting labs, 2-4 weeks follow-up Weight Management / SleepStudy. ? ?If you have any other questions or concerns,  please feel free to call the office or send a message through [place]. You may also schedule an earlier appointment if necessary. ? ?Additionally, you may be receiving a survey about your experience at our office within a few days to 1 week by e-mail or mail. We value your feedback. ? ?Nobie Putnam, DO ?[place] ?

## 2022-03-22 NOTE — Assessment & Plan Note (Signed)
Stable >20+ years without recurrence or complication ?S/p radiation therapy soon after dx at young age 31 ?Fam history with brother ?

## 2022-03-22 NOTE — Assessment & Plan Note (Signed)
Well-controlled HTN outside of office ?Still concern with elevated BP in doctors office white coat HTN, elevated here. Improving on recheck ?- Home BP readings reviewed  ?Concern with obesity and possible sleep apnea, will further test ?  ?Plan:  ?1.  Continue current BP regimen Losartan '50mg'$  daily ?2. Encourage improved lifestyle - low sodium diet, regular exercise ?3. Continue monitor BP outside office, bring readings to next visit, if persistently >140/90 or new symptoms notify office sooner ?4. Follow-up few weeks on weight management and sleep apnea testing ? ?

## 2022-03-22 NOTE — Progress Notes (Signed)
? ?Subjective:  ? ? Patient ID: Robert Rose, male    DOB: Jul 11, 1991, 31 y.o.   MRN: [id number] ? ?Robert Rose is a 31 y.o. male presenting on 03/22/2022 for Annual Exam ? ? ?HPI ? ?Here for Annual Physical and Lab Review ?  ?Obesity BMI >36 ?Highest peak weight now ?He says constantly fluctuating and has issues with managing weight ?  ?Hypertension ?Known history of elevated BP in past. He has monitored home BP regularly. ?Last year 2022 he was started on Losartan '50mg'$  daily, his BP was ranging early on medication 130/70s, highest range at home avg is 140/90 but usually less. ?Usually higher BP in doctors office, he can feel his BP raising while sitting in office and will be higher esp with DBP 90-100+ in office ?He feels overall much improved on BP medication ?See prior notes for background information. ?Current Meds - Losartan '50mg'$  daily ?Lifestyle: ?Down 5 lbs ?- Diet: poor diet ?- Exercise: Recent change of job he is doing better mentally, less stress, home more, he has less steps at work however, less active ?- Substances: Used smokeless tobacco dip for few years. Caffeine intake. ?Denies CP, dyspnea, HA, edema, dizziness / lightheadedness ? ?He questions about sleep apnea. ? ?  ?Elevated LDL ?Last labs 02/2021, LDL at 115, had elevated from previous ?   ?Family history of cancer ?- Brother also dx with retinoblastoma and later dx with melanoma. ?- Family is always going to dermatology q 6 months for surveillance. ?  ?  ?Father has history of cardiac abnormality with some possible arrhythmia ?Mother has Aortic valve issue and aneurysm. ?  ?Health Maintenance: ?UTD TDap ?UTD COVID19 vaccine J&J 01/2020 ?Declines routine HIV screen ? ? ? ?  03/22/2022  ?  2:04 PM 03/22/2022  ?  1:51 PM 03/15/2021  ?  4:11 PM  ?Depression screen PHQ 2/9  ?Decreased Interest 0 0 0  ?Down, Depressed, Hopeless 0 0 0  ?PHQ - 2 Score 0 0 0  ?Altered sleeping 0    ?Tired, decreased energy 2    ?Change in appetite 1    ?Feeling bad  or failure about yourself  0    ?Trouble concentrating 3    ?Moving slowly or fidgety/restless 2    ?Suicidal thoughts 0    ?PHQ-9 Score 8    ?Difficult doing work/chores Very difficult    ? ? ?Past Medical History:  ?Diagnosis Date  ? ADD (attention deficit disorder)   ? Anxiety   ? 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 ?History reviewed. No pertinent surgical history. ?Social History  ? ?Socioeconomic History  ? Marital status: Married  ?  Spouse name: Not on file  ? Number of children: Not on file  ? Years of education: Not on file  ? Highest education level: Not on file  ?Occupational History  ? Not on file  ?Tobacco Use  ? Smoking status: Never  ? Smokeless tobacco: Never  ?Substance and Sexual Activity  ? Alcohol use: Yes  ? Drug use: No  ? Sexual activity: Not on file  ?Other Topics Concern  ? Not on file  ?Social History Narrative  ? Not on file  ? ?Social Determinants of Health  ? ?Financial Resource Strain: Not on file  ?Food Insecurity: Not on file  ?Transportation Needs: Not on file  ?Physical Activity: Not on file  ?Stress: Not on file  ?Social Connections: Not on file  ?Intimate Partner Violence: Not on  file  ? ?Family History  ?Problem Relation Age of Onset  ? Hypertension Mother   ? Other Mother   ?     Congenital bicuspid aortic valve, and his cousin  ? Valvular heart disease Mother   ?     aortic bicuspid valve  ? Hypertension Father   ? ADD / ADHD Father 31  ?     mild  ? Melanoma Father   ? Retinoblastoma Brother   ?     eye removal surgery  ? Melanoma Brother   ? Colon cancer Neg Hx   ? Prostate cancer Neg Hx   ? ?Current Outpatient Medications on File Prior to Visit  ?Medication Sig  ? losartan (COZAAR) 50 MG tablet Take 1 tablet (50 mg total) by mouth daily.  ? ?No current facility-administered medications on file prior to visit.  ? ? ?Review of Systems  ?Constitutional:  Negative for activity change, appetite change, chills, diaphoresis, fatigue and fever.  ?HENT:  Negative for congestion and  hearing loss.   ?Eyes:  Negative for visual disturbance.  ?Respiratory:  Negative for cough, chest tightness, shortness of breath and wheezing.   ?Cardiovascular:  Negative for chest pain, palpitations and leg swelling.  ?Gastrointestinal:  Negative for abdominal pain, constipation, diarrhea, nausea and vomiting.  ?Genitourinary:  Negative for dysuria, frequency and hematuria.  ?Musculoskeletal:  Negative for arthralgias and neck pain.  ?Skin:  Negative for rash.  ?Neurological:  Negative for dizziness, weakness, light-headedness, numbness and headaches.  ?Hematological:  Negative for adenopathy.  ?Psychiatric/Behavioral:  Negative for behavioral problems, dysphoric mood and sleep disturbance.   ?Per HPI unless specifically indicated above ? ? ?   ?Objective:  ?  ?BP (!) 138/94 (BP Location: Left Arm, Cuff Size: Normal)   Pulse 85   Ht 6' (1.829 m)   Wt 267 lb 9.6 oz (121.4 kg)   SpO2 99%   BMI 36.29 kg/m?   ?Wt Readings from Last 3 Encounters:  ?03/22/22 267 lb 9.6 oz (121.4 kg)  ?03/15/21 272 lb 6.4 oz (123.6 kg)  ?02/27/20 261 lb 3.2 oz (118.5 kg)  ?  ?Physical Exam ?Vitals and nursing note reviewed.  ?Constitutional:   ?   General: He is not in acute distress. ?   Appearance: He is well-developed. He is obese. He is not diaphoretic.  ?   Comments: Well-appearing, comfortable, cooperative  ?HENT:  ?   Head: Normocephalic and atraumatic.  ?Eyes:  ?   General:     ?   Right eye: No discharge.     ?   Left eye: No discharge.  ?   Conjunctiva/sclera: Conjunctivae normal.  ?   Pupils: Pupils are equal, round, and reactive to light.  ?Neck:  ?   Thyroid: No thyromegaly.  ?   Vascular: No carotid bruit.  ?Cardiovascular:  ?   Rate and Rhythm: Normal rate and regular rhythm.  ?   Pulses: Normal pulses.  ?   Heart sounds: Normal heart sounds. No murmur heard. ?Pulmonary:  ?   Effort: Pulmonary effort is normal. No respiratory distress.  ?   Breath sounds: Normal breath sounds. No wheezing or rales.  ?Abdominal:  ?    General: Bowel sounds are normal. There is no distension.  ?   Palpations: Abdomen is soft. There is no mass.  ?   Tenderness: There is no abdominal tenderness.  ?Musculoskeletal:     ?   General: No tenderness. Normal range of motion.  ?  Cervical back: Normal range of motion and neck supple.  ?   Right lower leg: No edema.  ?   Left lower leg: No edema.  ?   Comments: Upper / Lower Extremities: ?- Normal muscle tone, strength bilateral upper extremities 5/5, lower extremities 5/5  ?Lymphadenopathy:  ?   Cervical: No cervical adenopathy.  ?Skin: ?   General: Skin is warm and dry.  ?   Findings: No erythema or rash.  ?Neurological:  ?   Mental Status: He is alert and oriented to person, place, and time.  ?   Comments: Distal sensation intact to light touch all extremities  ?Psychiatric:     ?   Mood and Affect: Mood normal.     ?   Behavior: Behavior normal.     ?   Thought Content: Thought content normal.  ?   Comments: Well groomed, good eye contact, normal speech and thoughts  ? ? ? ? ?Results for orders placed or performed in visit on 03/08/21  ?Lipid panel  ?Result Value Ref Range  ? Cholesterol 181 <200 mg/dL  ? HDL 46 > OR = 40 mg/dL  ? Triglycerides 101 <150 mg/dL  ? LDL Cholesterol (Calc) 115 (H) mg/dL (calc)  ? Total CHOL/HDL Ratio 3.9 <5.0 (calc)  ? Non-HDL Cholesterol (Calc) 135 (H) <130 mg/dL (calc)  ?COMPLETE METABOLIC PANEL WITH GFR  ?Result Value Ref Range  ? Glucose, Bld 103 (H) 65 - 99 mg/dL  ? BUN 18 7 - 25 mg/dL  ? Creat 0.96 0.60 - 1.35 mg/dL  ? GFR, Est Non African American 105 > OR = 60 mL/min/1.64m  ? GFR, Est African American 122 > OR = 60 mL/min/1.734m ? BUN/Creatinine Ratio NOT APPLICABLE 6 - 22 (calc)  ? Sodium 138 135 - 146 mmol/L  ? Potassium 4.1 3.5 - 5.3 mmol/L  ? Chloride 104 98 - 110 mmol/L  ? CO2 26 20 - 32 mmol/L  ? Calcium 9.4 8.6 - 10.3 mg/dL  ? Total Protein 7.3 6.1 - 8.1 g/dL  ? Albumin 4.8 3.6 - 5.1 g/dL  ? Globulin 2.5 1.9 - 3.7 g/dL (calc)  ? AG Ratio 1.9 1.0 - 2.5  (calc)  ? Total Bilirubin 0.5 0.2 - 1.2 mg/dL  ? Alkaline phosphatase (APISO) 68 36 - 130 U/L  ? AST 22 10 - 40 U/L  ? ALT 30 9 - 46 U/L  ?CBC with Differential/Platelet  ?Result Value Ref Range  ? WBC 6.6 3.8

## 2022-03-24 ENCOUNTER — Encounter: Payer: Self-pay | Admitting: Family Medicine

## 2022-03-24 ENCOUNTER — Telehealth: Payer: Self-pay | Admitting: Family Medicine

## 2022-03-24 ENCOUNTER — Other Ambulatory Visit: Payer: Self-pay

## 2022-03-24 DIAGNOSIS — Z0000 Encounter for general adult medical examination without abnormal findings: Secondary | ICD-10-CM

## 2022-03-24 DIAGNOSIS — R7309 Other abnormal glucose: Secondary | ICD-10-CM

## 2022-03-24 DIAGNOSIS — E786 Lipoprotein deficiency: Secondary | ICD-10-CM

## 2022-03-24 DIAGNOSIS — I10 Essential (primary) hypertension: Secondary | ICD-10-CM

## 2022-03-24 MED ORDER — WEGOVY 0.25 MG/0.5ML ~~LOC~~ SOAJ: 0.2500 mg | SUBCUTANEOUS | 0 refills | Status: DC

## 2022-03-24 NOTE — Telephone Encounter (Signed)
Medication: WEGOVY 0.25 MG/0.5ML SOAJ  ? ?Has the patient contacted their pharmacy? Yes.   ?Needs a PA for the medication ? ?Preferred Pharmacy (with phone number or street name):  ?[id number] [place] - [address]  ?[id number] ?Phone:  [phone] Fax:  [phone] ? ?

## 2022-03-25 LAB — COMPLETE METABOLIC PANEL WITH GFR
AG Ratio: 1.6 (calc) (ref 1.0–2.5)
ALT: 35 U/L (ref 9–46)
AST: 22 U/L (ref 10–40)
Albumin: 4.5 g/dL (ref 3.6–5.1)
Alkaline phosphatase (APISO): 59 U/L (ref 36–130)
BUN: 21 mg/dL (ref 7–25)
CO2: 26 mmol/L (ref 20–32)
Calcium: 9 mg/dL (ref 8.6–10.3)
Chloride: 107 mmol/L (ref 98–110)
Creat: 1.02 mg/dL (ref 0.60–1.26)
Globulin: 2.8 g/dL (calc) (ref 1.9–3.7)
Glucose, Bld: 106 mg/dL — ABNORMAL HIGH (ref 65–99)
Potassium: 4.5 mmol/L (ref 3.5–5.3)
Sodium: 141 mmol/L (ref 135–146)
Total Bilirubin: 0.5 mg/dL (ref 0.2–1.2)
Total Protein: 7.3 g/dL (ref 6.1–8.1)
eGFR: 100 mL/min/{1.73_m2} (ref >=60)

## 2022-03-25 LAB — LIPID PANEL
Cholesterol: 146 mg/dL (ref <200)
HDL: 34 mg/dL — ABNORMAL LOW (ref >=40)
LDL Cholesterol (Calc): 90 mg/dL (calc)
Non-HDL Cholesterol (Calc): 112 mg/dL (calc) (ref <130)
Total CHOL/HDL Ratio: 4.3 (calc) (ref <5.0)
Triglycerides: 128 mg/dL (ref <150)

## 2022-03-25 LAB — CBC WITH DIFFERENTIAL/PLATELET
Absolute Monocytes: 893 cells/uL (ref 200–950)
Basophils Absolute: 50 cells/uL (ref 0–200)
Basophils Relative: 0.7 %
Eosinophils Absolute: 238 cells/uL (ref 15–500)
Eosinophils Relative: 3.3 %
HCT: 44.1 % (ref 38.5–50.0)
Hemoglobin: 15 g/dL (ref 13.2–17.1)
Lymphs Abs: 2902 cells/uL (ref 850–3900)
MCH: 29.8 pg (ref 27.0–33.0)
MCHC: 34 g/dL (ref 32.0–36.0)
MCV: 87.5 fL (ref 80.0–100.0)
MPV: 9.2 fL (ref 7.5–12.5)
Monocytes Relative: 12.4 %
Neutro Abs: 3118 cells/uL (ref 1500–7800)
Neutrophils Relative %: 43.3 %
Platelets: 264 10*3/uL (ref 140–400)
RBC: 5.04 10*6/uL (ref 4.20–5.80)
RDW: 12.8 % (ref 11.0–15.0)
Total Lymphocyte: 40.3 %
WBC: 7.2 10*3/uL (ref 3.8–10.8)

## 2022-03-25 LAB — TSH: TSH: 1.05 mIU/L (ref 0.40–4.50)

## 2022-03-25 LAB — HEMOGLOBIN A1C W/OUT EAG: Hgb A1c MFr Bld: 5.2 % of total Hgb (ref <5.7)

## 2022-04-18 MED ORDER — WEGOVY 0.5 MG/0.5ML ~~LOC~~ SOAJ: 0.5000 mg | SUBCUTANEOUS | 1 refills | Status: DC

## 2022-04-18 NOTE — Addendum Note (Signed)
Addended by: Olin Hauser on: 04/18/2022 12:43 PM   Modules accepted: Orders

## 2022-05-04 MED ORDER — WEGOVY 1.7 MG/0.75ML ~~LOC~~ SOAJ: 1.7000 mg | SUBCUTANEOUS | 2 refills | Status: DC

## 2022-05-04 NOTE — Addendum Note (Signed)
Addended by: Olin Hauser on: 05/04/2022 01:30 PM   Modules accepted: Orders

## 2022-05-15 ENCOUNTER — Other Ambulatory Visit: Payer: Self-pay

## 2022-05-15 DIAGNOSIS — I10 Essential (primary) hypertension: Secondary | ICD-10-CM

## 2022-05-15 MED ORDER — LOSARTAN POTASSIUM 50 MG PO TABS: 50.0000 mg | ORAL_TABLET | Freq: Every day | ORAL | 1 refills | Status: DC

## 2022-05-18 ENCOUNTER — Other Ambulatory Visit: Payer: Self-pay | Admitting: Family Medicine

## 2022-05-18 DIAGNOSIS — I10 Essential (primary) hypertension: Secondary | ICD-10-CM

## 2022-05-18 NOTE — Telephone Encounter (Signed)
Medication was refilled 05/15/22 by PCP for 90, 1 refill. This is a duplicate request, will refuse.  Requested Prescriptions  Pending Prescriptions Disp Refills  . losartan (COZAAR) 50 MG tablet [Pharmacy Med Name: LOSARTAN POTASSIUM 50 MG TAB] 90 tablet 1    Sig: TAKE 1 TABLET BY MOUTH EVERY DAY     Cardiovascular:  Angiotensin Receptor Blockers Failed - 05/18/2022  2:40 AM      Failed - Last BP in normal range    BP Readings from Last 1 Encounters:  03/22/22 (!) 138/94         Passed - Cr in normal range and within 180 days    Creat  Date Value Ref Range Status  03/24/2022 1.02 0.60 - 1.26 mg/dL Final         Passed - K in normal range and within 180 days    Potassium  Date Value Ref Range Status  03/24/2022 4.5 3.5 - 5.3 mmol/L Final         Passed - Patient is not pregnant      Passed - Valid encounter within last 6 months    Recent Outpatient Visits          1 month ago Annual physical exam   [place], DO   1 year ago Annual physical exam   [facility] Olin Hauser, DO   2 years ago Annual physical exam   [facility] Olin Hauser, DO   2 years ago COVID-57 virus infection   [place], DO   4 years ago Annual physical exam   [place], Devonne Doughty, DO

## 2022-07-21 ENCOUNTER — Encounter: Payer: Self-pay | Admitting: Family Medicine

## 2022-07-26 MED ORDER — WEGOVY 1.7 MG/0.75ML ~~LOC~~ SOAJ: 1.7000 mg | SUBCUTANEOUS | 2 refills | Status: DC

## 2022-07-26 NOTE — Addendum Note (Signed)
Addended by: Olin Hauser on: 07/26/2022 06:52 PM   Modules accepted: Orders

## 2022-09-03 ENCOUNTER — Other Ambulatory Visit: Payer: Self-pay | Admitting: Family Medicine

## 2022-09-03 DIAGNOSIS — I10 Essential (primary) hypertension: Secondary | ICD-10-CM

## 2022-09-04 NOTE — Telephone Encounter (Signed)
rx was sent to mail order pharmacy 05/15/22 #90/1  Requested Prescriptions  Pending Prescriptions Disp Refills  . losartan (COZAAR) 50 MG tablet [Pharmacy Med Name: LOSARTAN POTASSIUM 50 MG TAB] 90 tablet 1    Sig: TAKE 1 TABLET BY MOUTH EVERY DAY     Cardiovascular:  Angiotensin Receptor Blockers Failed - 09/03/2022 10:38 PM      Failed - Last BP in normal range    BP Readings from Last 1 Encounters:  03/22/22 (!) 138/94         Passed - Cr in normal range and within 180 days    Creat  Date Value Ref Range Status  03/24/2022 1.02 0.60 - 1.26 mg/dL Final         Passed - K in normal range and within 180 days    Potassium  Date Value Ref Range Status  03/24/2022 4.5 3.5 - 5.3 mmol/L Final         Passed - Patient is not pregnant      Passed - Valid encounter within last 6 months    Recent Outpatient Visits          5 months ago Annual physical exam   [place], DO   1 year ago Annual physical exam   [facility] Olin Hauser, DO   2 years ago Annual physical exam   [facility] Olin Hauser, DO   3 years ago COVID-47 virus infection   [place], DO   4 years ago Annual physical exam   [place], Devonne Doughty, DO

## 2022-09-13 ENCOUNTER — Other Ambulatory Visit: Payer: Self-pay | Admitting: Family Medicine

## 2022-09-13 DIAGNOSIS — I10 Essential (primary) hypertension: Secondary | ICD-10-CM

## 2022-09-13 NOTE — Telephone Encounter (Signed)
Requested Prescriptions  Pending Prescriptions Disp Refills  . losartan (COZAAR) 50 MG tablet [Pharmacy Med Name: LOSARTAN POTASSIUM 50 MG TAB] 90 tablet 0    Sig: TAKE 1 TABLET BY MOUTH EVERY DAY     Cardiovascular:  Angiotensin Receptor Blockers Failed - 09/13/2022 12:24 AM      Failed - Last BP in normal range    BP Readings from Last 1 Encounters:  03/22/22 (!) 138/94         Passed - Cr in normal range and within 180 days    Creat  Date Value Ref Range Status  03/24/2022 1.02 0.60 - 1.26 mg/dL Final         Passed - K in normal range and within 180 days    Potassium  Date Value Ref Range Status  03/24/2022 4.5 3.5 - 5.3 mmol/L Final         Passed - Patient is not pregnant      Passed - Valid encounter within last 6 months    Recent Outpatient Visits          5 months ago Annual physical exam   [place], DO   1 year ago Annual physical exam   [facility] Olin Hauser, DO   2 years ago Annual physical exam   [facility] Olin Hauser, DO   3 years ago COVID-33 virus infection   [place], DO   4 years ago Annual physical exam   [place], DO             change of pharmacy

## 2022-10-25 ENCOUNTER — Encounter: Payer: Self-pay | Admitting: Family Medicine

## 2022-10-26 MED ORDER — WEGOVY 2.4 MG/0.75ML ~~LOC~~ SOAJ: 2.4000 mg | SUBCUTANEOUS | 2 refills | Status: DC

## 2022-12-18 ENCOUNTER — Other Ambulatory Visit: Payer: Self-pay | Admitting: Family Medicine

## 2022-12-18 DIAGNOSIS — I10 Essential (primary) hypertension: Secondary | ICD-10-CM

## 2022-12-19 NOTE — Telephone Encounter (Signed)
Called pt to make appt. Pt will call back for appt.

## 2022-12-19 NOTE — Telephone Encounter (Signed)
Requested medications are due for refill today.  yes  Requested medications are on the active medications list.  yes  Last refill. 09/13/2022 #90 0 rf  Future visit scheduled.   no  Notes to clinic.  Called pt to make appt. Pt will c/b for appt.    Requested Prescriptions  Pending Prescriptions Disp Refills   losartan (COZAAR) 50 MG tablet [Pharmacy Med Name: LOSARTAN POTASSIUM 50 MG TAB] 90 tablet 0    Sig: TAKE 1 TABLET BY MOUTH EVERY DAY     Cardiovascular:  Angiotensin Receptor Blockers Failed - 12/18/2022  1:19 AM      Failed - Cr in normal range and within 180 days    Creat  Date Value Ref Range Status  03/24/2022 1.02 0.60 - 1.26 mg/dL Final         Failed - K in normal range and within 180 days    Potassium  Date Value Ref Range Status  03/24/2022 4.5 3.5 - 5.3 mmol/L Final         Failed - Last BP in normal range    BP Readings from Last 1 Encounters:  03/22/22 (!) 138/94         Failed - Valid encounter within last 6 months    Recent Outpatient Visits           9 months ago Annual physical exam   [place], DO   1 year ago Annual physical exam   [facility] Olin Hauser, DO   2 years ago Annual physical exam   [facility] Olin Hauser, DO   3 years ago COVID-19 virus infection   [place], DO   4 years ago Annual physical exam   [facility] [place], [place] - Patient is not pregnant

## 2023-03-14 ENCOUNTER — Encounter: Payer: Self-pay | Admitting: Family Medicine

## 2023-03-14 ENCOUNTER — Ambulatory Visit (INDEPENDENT_AMBULATORY_CARE_PROVIDER_SITE_OTHER): Payer: Managed Care, Other (non HMO) | Admitting: Family Medicine

## 2023-03-14 VITALS — BP 148/107 | HR 117 | Ht 72.0 in | Wt 259.0 lb

## 2023-03-14 DIAGNOSIS — E786 Lipoprotein deficiency: Secondary | ICD-10-CM

## 2023-03-14 DIAGNOSIS — C6921 Malignant neoplasm of right retina: Secondary | ICD-10-CM

## 2023-03-14 DIAGNOSIS — R7309 Other abnormal glucose: Secondary | ICD-10-CM

## 2023-03-14 DIAGNOSIS — I10 Essential (primary) hypertension: Secondary | ICD-10-CM | POA: Diagnosis not present

## 2023-03-14 DIAGNOSIS — Z0000 Encounter for general adult medical examination without abnormal findings: Secondary | ICD-10-CM | POA: Diagnosis not present

## 2023-03-14 DIAGNOSIS — E66811 Obesity, class 1: Secondary | ICD-10-CM | POA: Insufficient documentation

## 2023-03-14 LAB — CBC WITH DIFFERENTIAL/PLATELET
HCT: 46.5 % (ref 38.5–50.0)
MCH: 29.7 pg (ref 27.0–33.0)
MCHC: 33.5 g/dL (ref 32.0–36.0)
MPV: 9.3 fL (ref 7.5–12.5)
Total Lymphocyte: 37.7 %
WBC: 5.9 10*3/uL (ref 3.8–10.8)

## 2023-03-14 MED ORDER — LOSARTAN POTASSIUM 100 MG PO TABS: 100.0000 mg | ORAL_TABLET | Freq: Every day | ORAL | 3 refills | Status: DC

## 2023-03-14 MED ORDER — ZEPBOUND 2.5 MG/0.5ML ~~LOC~~ SOAJ
2.5000 mg | SUBCUTANEOUS | 0 refills | Status: DC
Start: 2023-03-14 — End: 2023-04-26

## 2023-03-14 NOTE — Assessment & Plan Note (Signed)
Stable >20+ years without recurrence or complication S/p radiation therapy soon after dx at young age 32 Fam history with brother 

## 2023-03-14 NOTE — Assessment & Plan Note (Signed)
Improved weight loss, but still with elevated weight, BMI >35 with comorbid Hypertension, Hyperlipidemia at risk of other complications. Prior trial on Wegovy with success, now not covered  Start Zepbound 2.5mg  weekly inj, will work on authorization, dose increase monthly as directed  Goal to treat his weight and improve his HYPERTENSION and may also help sleep if he has underlying sleep apnea.

## 2023-03-14 NOTE — Assessment & Plan Note (Signed)
Check lipid panel Pending

## 2023-03-14 NOTE — Patient Instructions (Addendum)
Thank you for coming to the office today.  Dose increase Losartan from 50 to  daily, can double 50s if you need  Future if interested I would recommend a sleep study.  ----------------------  Call insurance find cost and coverage of the following - check the following: - Drug Tier, Preferred List, On Formulary - All will require a "Prior Authorization" from [place] first, before you can find out the cost - Find out if there is "Step Therapy" (other medicines required before you can try these)  Once you pick the one you want to try, let me know - we can get a sample ready IF we have it in stock. Then try it - and before running out of medicine, contact me back to order your Rx so we have time to get it processed.  For Weight Loss / Obesity only  Zepbound (Tirzeptide) 2.5mg  weekly inj, dose inc to 5 > 7.5 > 10 > 12.5 > 15  Contrave - oral medication, appetite suppression has wellbutrin/bupropion and naltrexone in it and it can also help with appetite, it is ordered through a speciality pharmacy.  Future make sure your insurance has weight loss coverage    Please schedule a Follow-up Appointment to: Return in about 4 months (around 07/14/2023) for 4 month follow-up Weight, Blood pressure.  If you have any other questions or concerns, please feel free to call the office or send a message through MyChart. You may also schedule an earlier appointment if necessary.  Additionally, you may be receiving a survey about your experience at our office within a few days to 1 week by e-mail or mail. We value your feedback.  Saralyn Pilar, DO [facility], [place]

## 2023-03-14 NOTE — Assessment & Plan Note (Signed)
Some elevated BP still. Still concern with elevated BP in doctors office white coat HTN, elevated here. Improving on recheck - Home BP readings reviewed  Concern with obesity and possible sleep apnea, will further test   Plan:  DOSE INCREASE Losartan 50 to  daily new rx order Encourage improved lifestyle - low sodium diet, regular exercise 3. Continue monitor BP outside office, bring readings to next visit, if persistently >140/90 or new symptoms notify office sooner 4. Follow-up few weeks on weight management and sleep apnea testing  Will also treat this comorbid condition with rx Zepbound if we can manage his weight. Future return for sleep eval PSG if indicated still due to resistant HTN

## 2023-03-14 NOTE — Progress Notes (Signed)
Subjective:    Patient ID: Robert Rose, male    DOB: November 10, 1991, 32 y.o.   MRN: [id number]  Robert Rose is a 32 y.o. male presenting on 03/14/2023 for Annual Exam   HPI  Here for Annual Physical and Lab Orders  Morbid Obesity BMI >35 He did well on Wegovy with weight loss 25 lbs over 1+ year, came off due to insurance not covering at end of year, and now had some elevated weight gain in past few months. Admits cruise in February 2024. Still down on his weight overall He says constantly fluctuating and has issues with managing weight He has issue without snoring < 250 lbs but if >250 has issue with snoring and poor sleep Has not had sleep study.   Hypertension Known history of elevated BP in past. He has monitored home BP regularly. Last year 2022 he was started on Losartan  daily, his BP was ranging early on medication 130/70s, highest range at home avg is 140/90 but usually less. Usually higher BP in doctors office, he can feel his BP raising while sitting in office and will be higher esp with DBP 90-100+ in office He feels overall much improved on BP medication See prior notes for background information. Current Meds - Losartan  daily Denies CP, dyspnea, HA, edema, dizziness / lightheadedness   Elevated LDL Last labs 02/2022 improved lipid Due today    Family history of cancer - Brother also dx with retinoblastoma and later dx with melanoma. - Family is always going to dermatology q 6 months for surveillance.     Father has history of cardiac abnormality with some possible arrhythmia Mother has Aortic valve issue and aneurysm.   Health Maintenance: UTD TDap UTD COVID19 vaccine J&J 01/2020 Declines routine HIV screen     03/14/2023    9:21 AM 03/22/2022    2:04 PM 03/22/2022    1:51 PM  Depression screen PHQ 2/9  Decreased Interest 0 0 0  Down, Depressed, Hopeless 0 0 0  PHQ - 2 Score 0 0 0  Altered sleeping 0 0   Tired, decreased energy 2 2    Change in appetite 0 1   Feeling bad or failure about yourself  0 0   Trouble concentrating 1 3   Moving slowly or fidgety/restless 0 2   Suicidal thoughts 0 0   PHQ-9 Score 3 8   Difficult doing work/chores  Very difficult     Past Medical History:  Diagnosis Date   ADD (attention deficit disorder)    Anxiety    Retinoblastoma    Right eye   No past surgical history on file. Social History   Socioeconomic History   Marital status: Married    Spouse name: Not on file   Number of children: Not on file   Years of education: Not on file   Highest education level: Bachelor's degree (e.g., BA, AB, BS)  Occupational History   Not on file  Tobacco Use   Smoking status: Never   Smokeless tobacco: Never  Substance and Sexual Activity   Alcohol use: Yes   Drug use: No   Sexual activity: Not on file  Other Topics Concern   Not on file  Social History Narrative   Not on file   Social Determinants of Health   Financial Resource Strain: Medium Risk (03/10/2023)   Overall Financial Resource Strain (CARDIA)    Difficulty of Paying Living Expenses: Somewhat hard  Food Insecurity: No Food  Insecurity (03/10/2023)   Hunger Vital Sign    Worried About Running Out of Food in the Last Year: Never true    Ran Out of Food in the Last Year: Never true  Transportation Needs: No Transportation Needs (03/10/2023)   PRAPARE - Administrator, Civil Service (Medical): No    Lack of Transportation (Non-Medical): No  Physical Activity: Insufficiently Active (03/10/2023)   Exercise Vital Sign    Days of Exercise per Week: 1 day    Minutes of Exercise per Session: 30 min  Stress: No Stress Concern Present (03/10/2023)   [employer] of Occupational Health - Occupational Stress Questionnaire    Feeling of Stress : Only a little  Social Connections: Socially Integrated (03/10/2023)   Social Connection and Isolation Panel [NHANES]    Frequency of Communication with Friends and  Family: Twice a week    Frequency of Social Gatherings with Friends and Family: Three times a week    Attends Religious Services: 1 to 4 times per year    Active Member of Clubs or Organizations: Yes    Attends Banker Meetings: 1 to 4 times per year    Marital Status: Married  Catering manager Violence: Not on file   Family History  Problem Relation Age of Onset   Hypertension Mother    Other Mother        Congenital bicuspid aortic valve, and his cousin   Valvular heart disease Mother        aortic bicuspid valve   Hypertension Father    ADD / ADHD Father 40       mild   Melanoma Father    Retinoblastoma Brother        eye removal surgery   Melanoma Brother    Colon cancer Neg Hx    Prostate cancer Neg Hx    No current outpatient medications on file prior to visit.   No current facility-administered medications on file prior to visit.    Review of Systems  Constitutional:  Negative for activity change, appetite change, chills, diaphoresis, fatigue and fever.  HENT:  Negative for congestion and hearing loss.   Eyes:  Negative for visual disturbance.  Respiratory:  Negative for cough, chest tightness, shortness of breath and wheezing.   Cardiovascular:  Negative for chest pain, palpitations and leg swelling.  Gastrointestinal:  Negative for abdominal pain, constipation, diarrhea, nausea and vomiting.  Genitourinary:  Negative for dysuria, frequency and hematuria.  Musculoskeletal:  Positive for back pain. Negative for arthralgias and neck pain.  Skin:  Negative for rash.  Neurological:  Negative for dizziness, weakness, light-headedness, numbness and headaches.  Hematological:  Negative for adenopathy.  Psychiatric/Behavioral:  Negative for behavioral problems, dysphoric mood and sleep disturbance.    Per HPI unless specifically indicated above      Objective:    BP (!) 148/107   Pulse (!) 117   Ht 6' (1.829 m)   Wt 259 lb (117.5 kg)   BMI 35.13  kg/m   Wt Readings from Last 3 Encounters:  03/14/23 259 lb (117.5 kg)  03/22/22 267 lb 9.6 oz (121.4 kg)  03/15/21 272 lb 6.4 oz (123.6 kg)    Physical Exam Vitals and nursing note reviewed.  Constitutional:      General: He is not in acute distress.    Appearance: He is well-developed. He is not diaphoretic.     Comments: Well-appearing, comfortable, cooperative  HENT:     Head: Normocephalic and  atraumatic.  Eyes:     General:        Right eye: No discharge.        Left eye: No discharge.     Conjunctiva/sclera: Conjunctivae normal.     Pupils: Pupils are equal, round, and reactive to light.  Neck:     Thyroid: No thyromegaly.  Cardiovascular:     Rate and Rhythm: Normal rate and regular rhythm.     Pulses: Normal pulses.     Heart sounds: Normal heart sounds. No murmur heard. Pulmonary:     Effort: Pulmonary effort is normal. No respiratory distress.     Breath sounds: Normal breath sounds. No wheezing or rales.  Abdominal:     General: Bowel sounds are normal. There is no distension.     Palpations: Abdomen is soft. There is no mass.     Tenderness: There is no abdominal tenderness.  Musculoskeletal:        General: No tenderness. Normal range of motion.     Cervical back: Normal range of motion and neck supple.     Right lower leg: No edema.     Left lower leg: No edema.     Comments: Upper / Lower Extremities: - Normal muscle tone, strength bilateral upper extremities 5/5, lower extremities 5/5  Lymphadenopathy:     Cervical: No cervical adenopathy.  Skin:    General: Skin is warm and dry.     Findings: No erythema or rash.  Neurological:     Mental Status: He is alert and oriented to person, place, and time.     Comments: Distal sensation intact to light touch all extremities  Psychiatric:        Mood and Affect: Mood normal.        Behavior: Behavior normal.        Thought Content: Thought content normal.     Comments: Well groomed, good eye contact,  normal speech and thoughts      Results for orders placed or performed in visit on 03/24/22  TSH  Result Value Ref Range   TSH 1.05 0.40 - 4.50 mIU/L  Lipid panel  Result Value Ref Range   Cholesterol 146 <200 mg/dL   HDL 34 (L) > OR = 40 mg/dL   Triglycerides 045 <409 mg/dL   LDL Cholesterol (Calc) 90 mg/dL (calc)   Total CHOL/HDL Ratio 4.3 <5.0 (calc)   Non-HDL Cholesterol (Calc) 112 <130 mg/dL (calc)  COMPLETE METABOLIC PANEL WITH GFR  Result Value Ref Range   Glucose, Bld 106 (H) 65 - 99 mg/dL   BUN 21 7 - 25 mg/dL   Creat 8.11 9.14 - 7.82 mg/dL   eGFR 956 > OR = 60 OZ/HYQ/6.57Q4   BUN/Creatinine Ratio NOT APPLICABLE 6 - 22 (calc)   Sodium 141 135 - 146 mmol/L   Potassium 4.5 3.5 - 5.3 mmol/L   Chloride 107 98 - 110 mmol/L   CO2 26 20 - 32 mmol/L   Calcium 9.0 8.6 - 10.3 mg/dL   Total Protein 7.3 6.1 - 8.1 g/dL   Albumin 4.5 3.6 - 5.1 g/dL   Globulin 2.8 1.9 - 3.7 g/dL (calc)   AG Ratio 1.6 1.0 - 2.5 (calc)   Total Bilirubin 0.5 0.2 - 1.2 mg/dL   Alkaline phosphatase (APISO) 59 36 - 130 U/L   AST 22 10 - 40 U/L   ALT 35 9 - 46 U/L  Hemoglobin A1C w/out eAG  Result Value Ref Range   Hgb A1c  MFr Bld 5.2 <5.7 % of total Hgb  CBC with Differential/Platelet  Result Value Ref Range   WBC 7.2 3.8 - 10.8 Thousand/uL   RBC 5.04 4.20 - 5.80 Million/uL   Hemoglobin 15.0 13.2 - 17.1 g/dL   HCT 16.1 09.6 - 04.5 %   MCV 87.5 80.0 - 100.0 fL   MCH 29.8 27.0 - 33.0 pg   MCHC 34.0 32.0 - 36.0 g/dL   RDW 40.9 81.1 - 91.4 %   Platelets 264 140 - 400 Thousand/uL   MPV 9.2 7.5 - 12.5 fL   Neutro Abs 3,118 1,500 - 7,800 cells/uL   Lymphs Abs 2,902 850 - 3,900 cells/uL   Absolute Monocytes 893 200 - 950 cells/uL   Eosinophils Absolute 238 15 - 500 cells/uL   Basophils Absolute 50 0 - 200 cells/uL   Neutrophils Relative % 43.3 %   Total Lymphocyte 40.3 %   Monocytes Relative 12.4 %   Eosinophils Relative 3.3 %   Basophils Relative 0.7 %      Assessment & Plan:   Problem  List Items Addressed This Visit     Essential hypertension    Some elevated BP still. Still concern with elevated BP in doctors office white coat HTN, elevated here. Improving on recheck - Home BP readings reviewed  Concern with obesity and possible sleep apnea, will further test   Plan:  DOSE INCREASE Losartan 50 to 100mg  daily new rx order Encourage improved lifestyle - low sodium diet, regular exercise 3. Continue monitor BP outside office, bring readings to next visit, if persistently >140/90 or new symptoms notify office sooner 4. Follow-up few weeks on weight management and sleep apnea testing  Will also treat this comorbid condition with rx Zepbound if we can manage his weight. Future return for sleep eval PSG if indicated still due to resistant HTN      Relevant Medications   losartan (COZAAR) 100 MG tablet   Other Relevant Orders   COMPLETE METABOLIC PANEL WITH GFR   CBC with Differential/Platelet   Low HDL (under 40)    Check lipid panel Pending      Relevant Orders   Lipid panel   Morbid obesity    Improved weight loss, but still with elevated weight, BMI >35 with comorbid Hypertension, Hyperlipidemia at risk of other complications. Prior trial on Wegovy with success, now not covered  Start Zepbound 2.5mg  weekly inj, will work on authorization, dose increase monthly as directed  Goal to treat his weight and improve his HYPERTENSION and may also help sleep if he has underlying sleep apnea.      Relevant Medications   ZEPBOUND 2.5 MG/0.5ML Pen   Other Relevant Orders   COMPLETE METABOLIC PANEL WITH GFR   Lipid panel   Retinoblastoma    Stable >20+ years without recurrence or complication S/p radiation therapy soon after dx at young age 21 Fam history with brother      Other Visit Diagnoses     Annual physical exam    -  Primary   Relevant Orders   COMPLETE METABOLIC PANEL WITH GFR   CBC with Differential/Platelet   Hemoglobin A1c   Lipid panel   TSH    Abnormal glucose       Relevant Orders   Hemoglobin A1c       Updated Health Maintenance information Fasting labs ordered today. Encouraged improvement to lifestyle with diet and exercise Goal of weight loss   Meds ordered this encounter  Medications  ZEPBOUND 2.5 MG/0.5ML Pen    Sig: Inject 2.5 mg into the skin once a week.    Dispense:  2 mL    Refill:  0   losartan (COZAAR) 100 MG tablet    Sig: Take 1 tablet (100 mg total) by mouth daily.    Dispense:  90 tablet    Refill:  3    Dose increase      Follow up plan: Return in about 4 months (around 07/14/2023) for 4 month follow-up Weight, Blood pressure.  Saralyn Pilar, DO [facility] 03/14/2023, 8:57 AM

## 2023-03-15 LAB — COMPLETE METABOLIC PANEL WITH GFR
AG Ratio: 2.1 (calc) (ref 1.0–2.5)
ALT: 27 U/L (ref 9–46)
AST: 24 U/L (ref 10–40)
Albumin: 5.1 g/dL (ref 3.6–5.1)
Alkaline phosphatase (APISO): 59 U/L (ref 36–130)
BUN: 25 mg/dL (ref 7–25)
CO2: 24 mmol/L (ref 20–32)
Calcium: 9.8 mg/dL (ref 8.6–10.3)
Chloride: 104 mmol/L (ref 98–110)
Creat: 1.09 mg/dL (ref 0.60–1.26)
Globulin: 2.4 g/dL (calc) (ref 1.9–3.7)
Glucose, Bld: 96 mg/dL (ref 65–99)
Potassium: 4.3 mmol/L (ref 3.5–5.3)
Sodium: 138 mmol/L (ref 135–146)
Total Bilirubin: 0.7 mg/dL (ref 0.2–1.2)
Total Protein: 7.5 g/dL (ref 6.1–8.1)
eGFR: 92 mL/min/{1.73_m2} (ref >=60)

## 2023-03-15 LAB — CBC WITH DIFFERENTIAL/PLATELET
Absolute Monocytes: 578 cells/uL (ref 200–950)
Basophils Absolute: 59 cells/uL (ref 0–200)
Basophils Relative: 1 %
Eosinophils Absolute: 201 cells/uL (ref 15–500)
Eosinophils Relative: 3.4 %
Hemoglobin: 15.6 g/dL (ref 13.2–17.1)
Lymphs Abs: 2224 cells/uL (ref 850–3900)
MCV: 88.4 fL (ref 80.0–100.0)
Monocytes Relative: 9.8 %
Neutro Abs: 2838 cells/uL (ref 1500–7800)
Neutrophils Relative %: 48.1 %
Platelets: 282 10*3/uL (ref 140–400)
RBC: 5.26 10*6/uL (ref 4.20–5.80)
RDW: 12.6 % (ref 11.0–15.0)

## 2023-03-15 LAB — LIPID PANEL
Cholesterol: 173 mg/dL (ref <200)
HDL: 43 mg/dL (ref >=40)
LDL Cholesterol (Calc): 105 mg/dL (calc) — ABNORMAL HIGH
Non-HDL Cholesterol (Calc): 130 mg/dL (calc) — ABNORMAL HIGH (ref <130)
Total CHOL/HDL Ratio: 4 (calc) (ref <5.0)
Triglycerides: 136 mg/dL (ref <150)

## 2023-03-15 LAB — HEMOGLOBIN A1C
Hgb A1c MFr Bld: 5.5 % of total Hgb (ref <5.7)
Mean Plasma Glucose: 111 mg/dL
eAG (mmol/L): 6.2 mmol/L

## 2023-03-15 LAB — TSH: TSH: 1.69 mIU/L (ref 0.40–4.50)

## 2023-03-15 NOTE — Progress Notes (Signed)
(  Key: [id number])  form thumbnail [employer] is reviewing your PA request and will respond within 24 hours for Medicaid or up to 72 hours for non-Medicaid plans, based on the required timeframe determined by state or federal regulations. To check for an update later, open this request from your dashboard.

## 2023-04-26 ENCOUNTER — Other Ambulatory Visit: Payer: Self-pay | Admitting: Family Medicine

## 2023-04-26 MED ORDER — ZEPBOUND 5 MG/0.5ML ~~LOC~~ SOAJ
5.0000 mg | SUBCUTANEOUS | 2 refills | Status: DC
Start: 2023-04-26 — End: 2023-07-09

## 2023-04-26 NOTE — Telephone Encounter (Signed)
Requested medication (s) are due for refill today: yes  Requested medication (s) are on the active medication list: yes    Last refill: 03/14/23  2ml 0 refills  Future visit scheduled  yes   07/16/23  Notes to clinic: Off protocol, please review. Thank you.  Requested Prescriptions  Pending Prescriptions Disp Refills   ZEPBOUND 2.5 MG/0.5ML Pen [Pharmacy Med Name: ZEPBOUND 2.5 MG/0.5 ML PEN]      Sig: INJECT 2.5 MG SUBCUTANEOUSLY WEEKLY     Off-Protocol Failed - 04/26/2023  8:27 AM      Failed - Medication not assigned to a protocol, review manually.      Passed - Valid encounter within last 12 months    Recent Outpatient Visits           1 month ago Annual physical exam   [place] [facility] [place], Netta Neat, DO   1 year ago Annual physical exam   [place] [facility] Smitty Cords, DO   2 years ago Annual physical exam   [place] [facility] Smitty Cords, DO   3 years ago Annual physical exam   [place] [facility] Smitty Cords, DO   3 years ago COVID-19 virus infection   [place] [facility] [place], Netta Neat, DO       Future Appointments             In 2 months Althea Charon, Netta Neat, DO [place] [facility], [facility]

## 2023-05-14 ENCOUNTER — Other Ambulatory Visit: Payer: Self-pay | Admitting: Family Medicine

## 2023-05-14 DIAGNOSIS — I10 Essential (primary) hypertension: Secondary | ICD-10-CM

## 2023-05-14 NOTE — Telephone Encounter (Signed)
Unable to refill per protocol, Rx expired. Discontinued 03/14/23, dose increase.  Requested Prescriptions  Pending Prescriptions Disp Refills   losartan (COZAAR) 50 MG tablet [Pharmacy Med Name: LOSARTAN POTASSIUM 50 MG TAB] 90 tablet 0    Sig: TAKE 1 TABLET BY MOUTH EVERY DAY     Cardiovascular:  Angiotensin Receptor Blockers Failed - 05/14/2023  2:26 AM      Failed - Last BP in normal range    BP Readings from Last 1 Encounters:  03/14/23 (!) 148/107         Passed - Cr in normal range and within 180 days    Creat  Date Value Ref Range Status  03/14/2023 1.09 0.60 - 1.26 mg/dL Final         Passed - K in normal range and within 180 days    Potassium  Date Value Ref Range Status  03/14/2023 4.3 3.5 - 5.3 mmol/L Final         Passed - Patient is not pregnant      Passed - Valid encounter within last 6 months    Recent Outpatient Visits           2 months ago Annual physical exam   [place] [facility] Smitty Cords, DO   1 year ago Annual physical exam   [place] [facility] Smitty Cords, DO   2 years ago Annual physical exam   [place] [facility] Smitty Cords, DO   3 years ago Annual physical exam   [place] [facility] Smitty Cords, DO   3 years ago COVID-19 virus infection   [place] [facility] [place], Netta Neat, DO       Future Appointments             In 2 months Althea Charon, Netta Neat, DO [place] [facility], [facility]

## 2023-06-29 ENCOUNTER — Encounter: Payer: Self-pay | Admitting: Family Medicine

## 2023-07-09 MED ORDER — TIRZEPATIDE-WEIGHT MANAGEMENT 7.5 MG/0.5ML ~~LOC~~ SOAJ: 7.5000 mg | SUBCUTANEOUS | 0 refills | Status: DC

## 2023-07-09 NOTE — Addendum Note (Signed)
Addended by: Lorre Munroe on: 07/09/2023 09:28 AM   Modules accepted: Orders

## 2023-07-16 ENCOUNTER — Ambulatory Visit: Payer: Managed Care, Other (non HMO) | Admitting: Family Medicine

## 2023-07-31 ENCOUNTER — Ambulatory Visit: Payer: Managed Care, Other (non HMO) | Admitting: Family Medicine

## 2023-08-13 ENCOUNTER — Other Ambulatory Visit: Payer: Self-pay | Admitting: Internal Medicine

## 2023-08-14 NOTE — Telephone Encounter (Signed)
Requested medication (s) are due for refill today:   Yes  Requested medication (s) are on the active medication list:   Yes  Future visit scheduled:   No   Last ordered: 07/09/2023 2 ml, 0 refills  Returned because there is not a protocol assigned to this medication.    Requested Prescriptions  Pending Prescriptions Disp Refills   ZEPBOUND 7.5 MG/0.5ML Pen [Pharmacy Med Name: ZEPBOUND 7.5MG /0.5ML INJ(4PF PENS)] 2 mL 0    Sig: ADMINISTER 7.5 MG UNDER THE SKIN 1 TIME A WEEK     Off-Protocol Failed - 08/13/2023  9:22 AM      Failed - Medication not assigned to a protocol, review manually.      Passed - Valid encounter within last 12 months    Recent Outpatient Visits           5 months ago Annual physical exam   [place] [facility] [place], Netta Neat, DO   1 year ago Annual physical exam   [place] [facility] Smitty Cords, DO   2 years ago Annual physical exam   [place] [facility] Smitty Cords, DO   3 years ago Annual physical exam   [place] [facility] Smitty Cords, DO   4 years ago COVID-19 virus infection   [place] [facility] [place], Netta Neat, [place]

## 2023-09-20 ENCOUNTER — Other Ambulatory Visit: Payer: Self-pay | Admitting: Family Medicine

## 2023-09-21 ENCOUNTER — Telehealth: Payer: Self-pay | Admitting: Family Medicine

## 2023-09-21 NOTE — Telephone Encounter (Signed)
Patient last seen in April 2024. He has been on injectable weight loss medicine, now with Zepbound. He requested a refill today. I signed the order for Zepbound 7.5mg  but it may not be approved unless we have a new weight on file. Patients should follow-up 3 months after starting weight loss med to document their progress, or else medicine can be denied if PA is not approved.  Please contact him to schedule weight management follow up with me  Thank you  Saralyn Pilar, DO [facility] 09/21/2023, 5:30 PM

## 2023-09-21 NOTE — Telephone Encounter (Signed)
Requested medications are due for refill today.  yes  Requested medications are on the active medications list.  yes  Last refill. 08/14/2023 2mL 0 rf  Future visit scheduled.   no  Notes to clinic.  Medication not assigned to a protocol. Please review for refill.    Requested Prescriptions  Pending Prescriptions Disp Refills   ZEPBOUND 7.5 MG/0.5ML Pen [Pharmacy Med Name: ZEPBOUND 7.5MG /0.5ML INJ(4PF PENS)] 2 mL 0    Sig: ADMINISTER 7.5 MG UNDER THE SKIN 1 TIME A WEEK     Off-Protocol Failed - 09/20/2023  4:19 PM      Failed - Medication not assigned to a protocol, review manually.      Passed - Valid encounter within last 12 months    Recent Outpatient Visits           6 months ago Annual physical exam   [place] [facility] [place], Netta Neat, DO   1 year ago Annual physical exam   [place] [facility] Smitty Cords, DO   2 years ago Annual physical exam   [place] [facility] Smitty Cords, DO   3 years ago Annual physical exam   [place] [facility] Smitty Cords, DO   4 years ago COVID-19 virus infection   [place] [facility] [place], Netta Neat, [place]

## 2023-09-24 NOTE — Telephone Encounter (Signed)
Mychart message sent to patient to schedule an appointment.

## 2023-10-16 ENCOUNTER — Encounter: Payer: Self-pay | Admitting: Family Medicine

## 2023-10-16 ENCOUNTER — Other Ambulatory Visit: Payer: Self-pay | Admitting: Family Medicine

## 2023-10-16 ENCOUNTER — Ambulatory Visit: Payer: Managed Care, Other (non HMO) | Admitting: Family Medicine

## 2023-10-16 ENCOUNTER — Telehealth: Payer: Self-pay

## 2023-10-16 DIAGNOSIS — E7800 Pure hypercholesterolemia, unspecified: Secondary | ICD-10-CM | POA: Diagnosis not present

## 2023-10-16 DIAGNOSIS — I10 Essential (primary) hypertension: Secondary | ICD-10-CM

## 2023-10-16 DIAGNOSIS — R7309 Other abnormal glucose: Secondary | ICD-10-CM

## 2023-10-16 DIAGNOSIS — Z0000 Encounter for general adult medical examination without abnormal findings: Secondary | ICD-10-CM

## 2023-10-16 MED ORDER — ZEPBOUND 10 MG/0.5ML ~~LOC~~ SOAJ
10.0000 mg | SUBCUTANEOUS | 2 refills | Status: DC
Start: 2023-10-16 — End: 2024-01-25

## 2023-10-16 NOTE — Progress Notes (Signed)
Subjective:    Patient ID: Robert Rose, male    DOB: 04-13-1991, 32 y.o.   MRN: [id number]  Robert Rose is a 32 y.o. male presenting on 10/16/2023 for Obesity   HPI  Discussed the use of AI scribe software for clinical note transcription with the patient, who gave verbal consent to proceed.  Morbid Obesity BMI >35 Early 2023 trial on Wegovy, he tried various doses and had some success with weight loss. Ultimately had issues with ordering it in stock and insurance coverage. He would do well with appetite suppression, also often he was active with walking and inc step counts.  He was switched to Zepbound earlier in 2024. He started on 2.5mg  then up to 5mg  then up to dose 7.5mg  - He has issues with side effect of sulfur burps. It has been covered. But less successful on weight loss. Interested to dose up on Zepbound from 7.5 to 10mg  to trial it first, before switching med. Now works from home and not walking, he is more sedentary less walking  Hypertension Known history of elevated BP in past. He has monitored home BP regularly. BP remains elevated DBP >90 so he has modified his diet eliminated sodium and also using oral appliance see below DBP now 80s Usually higher BP in doctors office Current Meds - Losartan 100mg  daily Denies CP, dyspnea, HA, edema, dizziness / lightheadedness  Possible Sleep Apnea Teeth Grinding Now using an oral appliance and improved greatly with his sleep. He did not pursue sleep study before. Now feels more rested and energy in AM  Elevated LDL Last labs 02/2023 with mild elevated LDL      10/16/2023   11:07 AM 03/14/2023    9:21 AM 03/22/2022    2:04 PM  Depression screen PHQ 2/9  Decreased Interest 0 0 0  Down, Depressed, Hopeless 0 0 0  PHQ - 2 Score 0 0 0  Altered sleeping 0 0 0  Tired, decreased energy 1 2 2   Change in appetite 1 0 1  Feeling bad or failure about yourself  0 0 0  Trouble concentrating 1 1 3   Moving slowly or  fidgety/restless 1 0 2  Suicidal thoughts 0 0 0  PHQ-9 Score 4 3 8   Difficult doing work/chores   Very difficult       10/16/2023   11:08 AM 03/14/2023    9:21 AM 03/22/2022    2:05 PM 12/19/2017    3:00 PM  GAD 7 : Generalized Anxiety Score  Nervous, Anxious, on Edge 0 0 1 1  Control/stop worrying 0 0 1 1  Worry too much - different things 0 0 1 2  Trouble relaxing 0 0 0 0  Restless 0 0 0 0  Easily annoyed or irritable 1 2 0 2  Afraid - awful might happen 0 0 0 2  Total GAD 7 Score 1 2 3 8   Anxiety Difficulty Not difficult at all  Not difficult at all Not difficult at all    Social History   Tobacco Use   Smoking status: Never   Smokeless tobacco: Never  Substance Use Topics   Alcohol use: Yes   Drug use: No    Review of Systems Per HPI unless specifically indicated above     Objective:    BP 134/86 (BP Location: Left Arm, Cuff Size: Normal)   Ht 6' (1.829 m)   Wt 265 lb (120.2 kg)   BMI 35.94 kg/m   Wt Readings  from Last 3 Encounters:  10/16/23 265 lb (120.2 kg)  03/14/23 259 lb (117.5 kg)  03/22/22 267 lb 9.6 oz (121.4 kg)    Physical Exam Vitals and nursing note reviewed.  Constitutional:      General: He is not in acute distress.    Appearance: He is well-developed. He is obese. He is not diaphoretic.     Comments: Well-appearing, comfortable, cooperative  HENT:     Head: Normocephalic and atraumatic.  Eyes:     General:        Right eye: No discharge.        Left eye: No discharge.     Conjunctiva/sclera: Conjunctivae normal.  Neck:     Thyroid: No thyromegaly.  Cardiovascular:     Rate and Rhythm: Normal rate and regular rhythm.     Pulses: Normal pulses.     Heart sounds: Normal heart sounds. No murmur heard. Pulmonary:     Effort: Pulmonary effort is normal. No respiratory distress.     Breath sounds: Normal breath sounds. No wheezing or rales.  Musculoskeletal:        General: Normal range of motion.     Cervical back: Normal range of  motion and neck supple.  Lymphadenopathy:     Cervical: No cervical adenopathy.  Skin:    General: Skin is warm and dry.     Findings: No erythema or rash.  Neurological:     Mental Status: He is alert and oriented to person, place, and time. Mental status is at baseline.  Psychiatric:        Behavior: Behavior normal.     Comments: Well groomed, good eye contact, normal speech and thoughts     Results for orders placed or performed in visit on 03/14/23  COMPLETE METABOLIC PANEL WITH GFR  Result Value Ref Range   Glucose, Bld 96 65 - 99 mg/dL   BUN 25 7 - 25 mg/dL   Creat 3.15 1.76 - 1.60 mg/dL   eGFR 92 > OR = 60 VP/XTG/6.26R4   BUN/Creatinine Ratio SEE NOTE: 6 - 22 (calc)   Sodium 138 135 - 146 mmol/L   Potassium 4.3 3.5 - 5.3 mmol/L   Chloride 104 98 - 110 mmol/L   CO2 24 20 - 32 mmol/L   Calcium 9.8 8.6 - 10.3 mg/dL   Total Protein 7.5 6.1 - 8.1 g/dL   Albumin 5.1 3.6 - 5.1 g/dL   Globulin 2.4 1.9 - 3.7 g/dL (calc)   AG Ratio 2.1 1.0 - 2.5 (calc)   Total Bilirubin 0.7 0.2 - 1.2 mg/dL   Alkaline phosphatase (APISO) 59 36 - 130 U/L   AST 24 10 - 40 U/L   ALT 27 9 - 46 U/L  CBC with Differential/Platelet  Result Value Ref Range   WBC 5.9 3.8 - 10.8 Thousand/uL   RBC 5.26 4.20 - 5.80 Million/uL   Hemoglobin 15.6 13.2 - 17.1 g/dL   HCT 85.4 62.7 - 03.5 %   MCV 88.4 80.0 - 100.0 fL   MCH 29.7 27.0 - 33.0 pg   MCHC 33.5 32.0 - 36.0 g/dL   RDW 00.9 38.1 - 82.9 %   Platelets 282 140 - 400 Thousand/uL   MPV 9.3 7.5 - 12.5 fL   Neutro Abs 2,838 1,500 - 7,800 cells/uL   Lymphs Abs 2,224 850 - 3,900 cells/uL   Absolute Monocytes 578 200 - 950 cells/uL   Eosinophils Absolute 201 15 - 500 cells/uL   Basophils Absolute 59  0 - 200 cells/uL   Neutrophils Relative % 48.1 %   Total Lymphocyte 37.7 %   Monocytes Relative 9.8 %   Eosinophils Relative 3.4 %   Basophils Relative 1.0 %  Hemoglobin A1c  Result Value Ref Range   Hgb A1c MFr Bld 5.5 <5.7 % of total Hgb   Mean  Plasma Glucose 111 mg/dL   eAG (mmol/L) 6.2 mmol/L  Lipid panel  Result Value Ref Range   Cholesterol 173 <200 mg/dL   HDL 43 > OR = 40 mg/dL   Triglycerides 841 <324 mg/dL   LDL Cholesterol (Calc) 105 (H) mg/dL (calc)   Total CHOL/HDL Ratio 4.0 <5.0 (calc)   Non-HDL Cholesterol (Calc) 130 (H) <130 mg/dL (calc)  TSH  Result Value Ref Range   TSH 1.69 0.40 - 4.50 mIU/L      Assessment & Plan:   Problem List Items Addressed This Visit     Essential hypertension   Morbid obesity (HCC) - Primary   Relevant Medications   ZEPBOUND 10 MG/0.5ML Pen   Other Visit Diagnoses     Elevated LDL cholesterol level            Morbid Obesity BMI >35 w/ comorbid features hypertension hyperlipidemia Weight Management Patient previously on Wegovy with good response but had issues with availability and insurance coverage. Switched to Zepbound with less satisfactory results and unpleasant side effects. Patient expressed interest in returning to [facility] if possible in future. -Increase Zepbound to 10mg . -Patient to check with insurance regarding coverage for [facility].  Hypertension Patient has been monitoring blood pressure at home and implementing lifestyle changes including a low sodium diet and improving sleep quality. Recent readings show improvement. -Continue current management and lifestyle modifications. Continue Losartan 100mg  daily  Possible Sleep Apnea Patient reports improved sleep quality with use of a device for teeth grinding and improved airflow. -Continue current management. Future reconsider sleep study if indicated.  Follow-up Annual physical due in April 2025. -Order blood panel prior to appointment.      ____________________________________________________ Additional Rx Information (May be used for Prior Authorization if required)  Medication name and Strength: Zepbound 10mg   Primary Diagnosis and ICD10 Code: Morbid Obesity BMI >35 (E66.01) Secondary Diagnosis and  ICD10 Code: Hypertension (I10), Hyperlipidemia (E78) Previous Failed Medications Wegovy Quantity and Duration of New Medication: 2 mL for 30 days Additional Supporting Information: Continuation of therapy. Prior dose Zepbound 7.5mg  ____________________________________________________    No orders of the defined types were placed in this encounter.   Meds ordered this encounter  Medications   ZEPBOUND 10 MG/0.5ML Pen    Sig: Inject 10 mg into the skin once a week.    Dispense:  2 mL    Refill:  2    Dose increase from 7.5 up to 10mg     Follow up plan: Return in about 5 months (around 03/15/2024) for 5 month fasting lab > 1 week later Annual Physical (early April, before 4/15).  Future labs 02/28/24  Saralyn Pilar, DO [facility] 10/16/2023, 9:53 AM

## 2023-10-16 NOTE — Telephone Encounter (Signed)
(  KeyNeill Loft) PA Case ID #: [id number] Need Help? Call us at [phone] Status sent iconSent to Plan today Drug Zepbound 2.5MG /0.5ML pen-injectors ePA cloud logo Form [employer] Electronic PA Form [phone] NCPDP) This request has been automatically started by CoverMyMeds based on the expiration date of a previously approved PA. If your patient requires continued therapy and is still covered under the same insurance plan you can use this form to submit the renewal. Please review the information as patient clinical information may have changed since the previous approval. If your patient is covered under a different plan and you need help selecting the correct form, chat with [place] in the bottom-right of your screen or call us at [phone].  If you don't need this PA, click 'delete' on the left side of your screen.  __    The following information is provided by CoverMyMeds.  Patient Assistance Patient assistance and financial support may be available through the manufacturer's patient savings program. For more information, and to see program requirements, follow the link below:  Patient Savings Program: click here.  Diagnosis Zepbound is indicated as an adjunct to a reduced-calorie diet and increased physical activity for chronic weight management in adults with an initial body mass index (BMI) of: 30 kg/m2 or greater (obesity) or 27 kg/m2 or greater (overweight) in the presence of at least one weight-related comorbid condition (e.g., hypertension, dyslipidemia, type 2 diabetes mellitus, obstructive sleep apnea, or cardiovascular disease). Limitations of Use: Zepbound contains tirzepatide. Coadministration with other tirzepatide-containing products or with any glucagon-like peptide-1 (GLP-1) receptor agonist is not recommended. The safety and efficacy of Zepbound in combination with other products intended for weight management, including prescription drugs, over-the-counter  drugs, and herbal preparations, have not been established. Zepbound has not been studied in patients with a history of pancreatitis.   WARNING: RISK OF THYROID C-CELL TUMORS In rats, tirzepatide causes dose-dependent and treatment-duration-dependent thyroid C-cell tumors at clinically relevant exposures. It is unknown whether Zepbound causes thyroid C-cell tumors, including medullary thyroid carcinoma (MTC), in humans as human relevance of tirzepatide-induced rodent thyroid C- cell tumors has not been determined. Zepbound is contraindicated in patients with a personal or family history of MTC or in patients with Multiple Endocrine Neoplasia syndrome type 2 (MEN 2). Counsel patients regarding the potential risk for MTC with the use of Zepbound and inform them of symptoms of thyroid tumors (e.g., a mass in the neck, dysphagia, dyspnea, persistent hoarseness). Routine monitoring of serum calcitonin or using thyroid ultrasound is of uncertain value for early detection of MTC in patients treated with Zepbound.   Prescribing Information: click here. Important Safety Information: click here.

## 2023-10-16 NOTE — Telephone Encounter (Signed)
Received prior auth, but patient is scheduled today to discuss other options for medications.   Robert Rose (Key: [id number]) Need Help? Call us at [phone] Status New (Not sent to plan) Drug Zepbound 2.5MG /0.5ML pen-injectors ePA cloud logo Form [employer] Electronic PA Form [phone] NCPDP)

## 2023-10-16 NOTE — Telephone Encounter (Signed)
(  Key: [id number])  form thumbnail [employer] is reviewing your PA request and will respond within 24 hours for Medicaid or up to 72 hours for non-Medicaid plans, based on the required timeframe determined by state or federal regulations. To check for an update later, open this request from your dashboard.

## 2023-10-16 NOTE — Patient Instructions (Addendum)
Thank you for coming to the office today.  Keep up the good work with low sodium and oral appliance  Zepbound dose inc from 7.5 up to 10mg   Check with insurance or [facility] website for checking coverage.  BP improved today  Please schedule a Follow-up Appointment to: Return in about 5 months (around 03/15/2024) for 5 month fasting lab > 1 week later Annual Physical (early April, before 4/15).  If you have any other questions or concerns, please feel free to call the office or send a message through MyChart. You may also schedule an earlier appointment if necessary.  Additionally, you may be receiving a survey about your experience at our office within a few days to 1 week by e-mail or mail. We value your feedback.  Saralyn Pilar, DO [facility], [place]

## 2024-01-24 ENCOUNTER — Other Ambulatory Visit: Payer: Self-pay | Admitting: Family Medicine

## 2024-01-24 NOTE — Telephone Encounter (Signed)
 Requested medications are due for refill today.  yes  Requested medications are on the active medications list.  yes  Last refill. 10/16/2023 2mL 2 rf  Future visit scheduled.   yes  Notes to clinic.  Medication not assigned to a protocol. Please review for refill.    Requested Prescriptions  Pending Prescriptions Disp Refills   ZEPBOUND 10 MG/0.5ML Pen [Pharmacy Med Name: ZEPBOUND 10MG /0.5ML INJ (4 PF PENS)] 2 mL 2    Sig: ADMINISTER 10 MG UNDER THE SKIN 1 TIME A WEEK     Off-Protocol Failed - 01/24/2024  4:53 PM      Failed - Medication not assigned to a protocol, review manually.      Passed - Valid encounter within last 12 months    Recent Outpatient Visits           3 months ago Morbid obesity [facility])   [facility], Netta Neat, DO   10 months ago Annual physical exam   [facility] Smitty Cords, DO   1 year ago Annual physical exam   [facility] Smitty Cords, DO   2 years ago Annual physical exam   [place] [facility] Smitty Cords, DO   3 years ago Annual physical exam   [place] Smitty Cords, DO       Future Appointments             In 1 month Althea Charon, Netta Neat, DO [facility]

## 2024-02-28 ENCOUNTER — Other Ambulatory Visit: Payer: Self-pay

## 2024-03-06 ENCOUNTER — Encounter: Payer: Self-pay | Admitting: Family Medicine

## 2024-03-07 ENCOUNTER — Other Ambulatory Visit

## 2024-03-07 DIAGNOSIS — E7800 Pure hypercholesterolemia, unspecified: Secondary | ICD-10-CM

## 2024-03-07 DIAGNOSIS — I10 Essential (primary) hypertension: Secondary | ICD-10-CM

## 2024-03-07 DIAGNOSIS — R7309 Other abnormal glucose: Secondary | ICD-10-CM

## 2024-03-07 DIAGNOSIS — Z0000 Encounter for general adult medical examination without abnormal findings: Secondary | ICD-10-CM

## 2024-03-08 LAB — LIPID PANEL
Cholesterol: 142 mg/dL (ref <200)
HDL: 36 mg/dL — ABNORMAL LOW (ref >=40)
LDL Cholesterol (Calc): 84 mg/dL
Non-HDL Cholesterol (Calc): 106 mg/dL (ref <130)
Total CHOL/HDL Ratio: 3.9 (calc) (ref <5.0)
Triglycerides: 128 mg/dL (ref <150)

## 2024-03-08 LAB — CBC WITH DIFFERENTIAL/PLATELET
Absolute Lymphocytes: 2001 {cells}/uL (ref 850–3900)
Absolute Monocytes: 513 {cells}/uL (ref 200–950)
Basophils Absolute: 51 {cells}/uL (ref 0–200)
Basophils Relative: 0.9 %
Eosinophils Absolute: 188 {cells}/uL (ref 15–500)
Eosinophils Relative: 3.3 %
HCT: 44.7 % (ref 38.5–50.0)
Hemoglobin: 15.6 g/dL (ref 13.2–17.1)
MCH: 30.4 pg (ref 27.0–33.0)
MCHC: 34.9 g/dL (ref 32.0–36.0)
MCV: 87 fL (ref 80.0–100.0)
MPV: 9.9 fL (ref 7.5–12.5)
Monocytes Relative: 9 %
Neutro Abs: 2947 {cells}/uL (ref 1500–7800)
Neutrophils Relative %: 51.7 %
Platelets: 260 10*3/uL (ref 140–400)
RBC: 5.14 10*6/uL (ref 4.20–5.80)
RDW: 12.8 % (ref 11.0–15.0)
Total Lymphocyte: 35.1 %
WBC: 5.7 10*3/uL (ref 3.8–10.8)

## 2024-03-08 LAB — COMPLETE METABOLIC PANEL WITHOUT GFR
AG Ratio: 1.9 (calc) (ref 1.0–2.5)
ALT: 20 U/L (ref 9–46)
AST: 18 U/L (ref 10–40)
Albumin: 5.1 g/dL (ref 3.6–5.1)
Alkaline phosphatase (APISO): 68 U/L (ref 36–130)
BUN/Creatinine Ratio: 26 (calc) — ABNORMAL HIGH (ref 6–22)
BUN: 26 mg/dL — ABNORMAL HIGH (ref 7–25)
CO2: 26 mmol/L (ref 20–32)
Calcium: 9.6 mg/dL (ref 8.6–10.3)
Chloride: 104 mmol/L (ref 98–110)
Creat: 1.01 mg/dL (ref 0.60–1.26)
Globulin: 2.7 g/dL (ref 1.9–3.7)
Glucose, Bld: 84 mg/dL (ref 65–99)
Potassium: 3.9 mmol/L (ref 3.5–5.3)
Sodium: 138 mmol/L (ref 135–146)
Total Bilirubin: 0.6 mg/dL (ref 0.2–1.2)
Total Protein: 7.8 g/dL (ref 6.1–8.1)

## 2024-03-08 LAB — HEMOGLOBIN A1C
Hgb A1c MFr Bld: 5 %{Hb} (ref <5.7)
Mean Plasma Glucose: 97 mg/dL
eAG (mmol/L): 5.4 mmol/L

## 2024-03-08 LAB — TSH: TSH: 1.3 m[IU]/L (ref 0.40–4.50)

## 2024-03-14 ENCOUNTER — Encounter: Admitting: Family Medicine

## 2024-03-18 ENCOUNTER — Other Ambulatory Visit

## 2024-03-21 ENCOUNTER — Ambulatory Visit (INDEPENDENT_AMBULATORY_CARE_PROVIDER_SITE_OTHER): Admitting: Family Medicine

## 2024-03-21 ENCOUNTER — Encounter: Payer: Self-pay | Admitting: Family Medicine

## 2024-03-21 ENCOUNTER — Telehealth: Payer: Self-pay

## 2024-03-21 ENCOUNTER — Other Ambulatory Visit: Payer: Self-pay | Admitting: Family Medicine

## 2024-03-21 VITALS — BP 110/80 | HR 82 | Resp 18 | Ht 72.0 in | Wt 249.6 lb

## 2024-03-21 DIAGNOSIS — Z8249 Family history of ischemic heart disease and other diseases of the circulatory system: Secondary | ICD-10-CM

## 2024-03-21 DIAGNOSIS — E66811 Obesity, class 1: Secondary | ICD-10-CM

## 2024-03-21 DIAGNOSIS — I10 Essential (primary) hypertension: Secondary | ICD-10-CM

## 2024-03-21 DIAGNOSIS — E7800 Pure hypercholesterolemia, unspecified: Secondary | ICD-10-CM

## 2024-03-21 DIAGNOSIS — C6921 Malignant neoplasm of right retina: Secondary | ICD-10-CM

## 2024-03-21 DIAGNOSIS — Z0000 Encounter for general adult medical examination without abnormal findings: Secondary | ICD-10-CM

## 2024-03-21 DIAGNOSIS — R7309 Other abnormal glucose: Secondary | ICD-10-CM

## 2024-03-21 DIAGNOSIS — D229 Melanocytic nevi, unspecified: Secondary | ICD-10-CM

## 2024-03-21 MED ORDER — ZEPBOUND 10 MG/0.5ML ~~LOC~~ SOAJ: 10.0000 mg | SUBCUTANEOUS | 5 refills | Status: DC

## 2024-03-21 MED ORDER — LOSARTAN POTASSIUM 100 MG PO TABS: 100.0000 mg | ORAL_TABLET | Freq: Every day | ORAL | 3 refills | Status: DC

## 2024-03-21 NOTE — Patient Instructions (Addendum)
 Thank you for coming to the office today.  Excellent lab results  Refilled Zepbound  10mg  weekly, up to 6 months on re orders.  Refilled Losartan  100mg , in the future if still doing well, BP down, weight down, you may try to chip away and do half dose and see how you do.  You have been referred for a Coronary Calcium Score Cardiac CT Scan. This is a screening test for patients aged 35-50+ with cardiovascular risk factors or who are healthy but would be interested in Cardiovascular Screening for heart disease. Even if there is a family history of heart disease, this imaging can be useful. Typically it can be done every 5+ years or at a different timeline we agree on  The scan will look at the chest and mainly focus on the heart and identify early signs of calcium build up or blockages within the heart arteries. It is not 100% accurate for identifying blockages or heart disease, but it is useful to help us  predict who may have some early changes or be at risk in the future for a heart attack or cardiovascular problem.  The results are reviewed by a Cardiologist and they will document the results. It should become available on MyChart. Typically the results are divided into percentiles based on other patients of the same demographic and age. So it will compare your risk to others similar to you. If you have a higher score >99 or higher percentile >75%tile, it is recommended to consider Statin cholesterol therapy and or referral to Cardiologist. I will try to help explain your results and if we have questions we can contact the Cardiologist.  You will be contacted for scheduling. Usually it is done at any imaging facility through [facility], [facility] or [facility].  The cost is $99 flat fee total and it does not go through insurance, so no authorization is required.   Please schedule a Follow-up Appointment to: Return in about 1 year (around 03/21/2025) for 1 year  fasting lab > 1 week later Annual Physical.  If you have any other questions or concerns, please feel free to call the office or send a message through MyChart. You may also schedule an earlier appointment if necessary.  Additionally, you may be receiving a survey about your experience at our office within a few days to 1 week by e-mail or mail. We value your feedback.  Domingo Friend, DO [facility], [place]

## 2024-03-21 NOTE — Progress Notes (Signed)
 Subjective:    Patient ID: Robert Rose, male    DOB: 07-23-1991, 33 y.o.   MRN: [id number]  Robert Rose is a 33 y.o. male presenting on 03/21/2024 for Annual Exam   HPI  Discussed the use of AI scribe software for clinical note transcription with the patient, who gave verbal consent to proceed.  History of Present Illness   Robert Rose is a 33 year old male who presents for an annual physical exam.  He has a family history of significant cardiac issues, including his mother who underwent a valve replacement and aneurysm graft, and his father who is currently managing heart failure. He is aware of a genetic predisposition to cardiac abnormalities and is contemplating further evaluation due to this family history.  Recent blood work on March 07, 2024, showed a BUN of 26, creatinine of 1.01, and an A1c of 5.0. His LDL cholesterol has decreased from 105 to 84. No issues with anemia or thyroid function were reported.  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 He is scheduled for a procedure on May 1 to remove a skin lesion with moderate to severe atypia on left ankle.  Elevated BUN Lab showed 26, similar to prior, likely hydration issue Kidney function is stable w/ Creatinine  Obesity BMI >33 He is on Zepbound  for weight loss, resulting in a reduction from 265 pounds in November to 249 pounds currently. He suspects sleep apnea, noting that his symptoms improve with Zepbound  use. He takes a 10 mg dose of Zepbound  weekly.  Hypertension Controlled now with med and weight loss and hydration Usually higher BP in doctors office Today normal Current Meds - Losartan  100mg  daily Denies CP, dyspnea, HA, edema, dizziness / lightheadedness   Family history of cancer - Brother also dx with retinoblastoma and later dx with melanoma. - Family is always going to dermatology q 6 months for surveillance.  Atypical Mole L Ankle Left outer  ankle abnormal skin lesion. 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 He opts for CT Coronary Calcium Score Scan      03/21/2024    9:38 AM 10/16/2023   11:07 AM 03/14/2023    9:21 AM  Depression screen PHQ 2/9  Decreased Interest 0 0 0  Down, Depressed, Hopeless 0 0 0  PHQ - 2 Score 0 0 0  Altered sleeping 0 0 0  Tired, decreased energy 1 1 2   Change in appetite 0 1 0  Feeling bad or failure about yourself  0 0 0  Trouble concentrating 1 1 1   Moving slowly or fidgety/restless 0 1 0  Suicidal thoughts 0 0 0  PHQ-9 Score 2 4 3   Difficult doing work/chores Not difficult at all         03/21/2024    9:38 AM 10/16/2023   11:08 AM 03/14/2023    9:21 AM 03/22/2022    2:05 PM  GAD 7 : Generalized Anxiety Score  Nervous, Anxious, on Edge 1 0 0 1  Control/stop worrying 0 0 0 1  Worry too much - different things 0 0 0 1  Trouble relaxing 1 0 0 0  Restless 1 0 0 0  Easily annoyed or irritable 1 1 2  0  Afraid - awful might happen 0 0 0  0  Total GAD 7 Score 4 1 2 3   Anxiety Difficulty Not difficult at all Not difficult at all  Not difficult at all     Past Medical History:  Diagnosis Date   ADD (attention deficit disorder)    Anxiety    Retinoblastoma (HCC)    Right eye   History reviewed. No pertinent surgical history. Social History   Socioeconomic History   Marital status: Married    Spouse name: Not on file   Number of children: Not on file   Years of education: Not on file   Highest education level: Bachelor's degree (e.g., BA, AB, BS)  Occupational History   Not on file  Tobacco Use   Smoking status: Never   Smokeless tobacco: Never  Substance and Sexual Activity   Alcohol use: Yes   Drug use: No   Sexual activity: Not on file  Other Topics Concern   Not on file  Social History  Narrative   Not on file   Social Drivers of Health   Financial Resource Strain: Low Risk  (10/16/2023)   Overall Financial Resource Strain (CARDIA)    Difficulty of Paying Living Expenses: Not very hard  Food Insecurity: No Food Insecurity (10/16/2023)   Hunger Vital Sign    Worried About Running Out of Food in the Last Year: Never true    Ran Out of Food in the Last Year: Never true  Transportation Needs: No Transportation Needs (10/16/2023)   PRAPARE - Administrator, Civil Service (Medical): No    Lack of Transportation (Non-Medical): No  Physical Activity: Insufficiently Active (10/16/2023)   Exercise Vital Sign    Days of Exercise per Week: 1 day    Minutes of Exercise per Session: 60 min  Stress: No Stress Concern Present (10/16/2023)   [employer] of Occupational Health - Occupational Stress Questionnaire    Feeling of Stress : Not at all  Social Connections: Socially Integrated (10/16/2023)   Social Connection and Isolation Panel [NHANES]    Frequency of Communication with Friends and Family: More than three times a week    Frequency of Social Gatherings with Friends and Family: Twice a week    Attends Religious Services: 1 to 4 times per year    Active Member of [employer] or Organizations: Yes    Attends Banker Meetings: 1 to 4 times per year    Marital Status: Married  Catering manager Violence: Not At Risk (10/16/2023)   Humiliation, Afraid, Rape, and Kick questionnaire    Fear of Current or Ex-Partner: No    Emotionally Abused: No    Physically Abused: No    Sexually Abused: No   Family History  Problem Relation Age of Onset   Hypertension Mother    Other Mother        Congenital bicuspid aortic valve, and his cousin   Valvular heart disease Mother        aortic bicuspid valve   Hypertension Father    ADD / ADHD Father 40       mild   Melanoma Father    Retinoblastoma Brother        eye removal surgery   Melanoma Brother     Colon cancer Neg Hx    Prostate cancer Neg Hx    No current outpatient medications on file prior to visit.   No current facility-administered medications on file prior to visit.    Review of Systems  Constitutional:  Negative  for activity change, appetite change, chills, diaphoresis, fatigue and fever.  HENT:  Negative for congestion and hearing loss.   Eyes:  Negative for visual disturbance.  Respiratory:  Negative for cough, chest tightness, shortness of breath and wheezing.   Cardiovascular:  Negative for chest pain, palpitations and leg swelling.  Gastrointestinal:  Negative for abdominal pain, constipation, diarrhea, nausea and vomiting.  Genitourinary:  Negative for dysuria, frequency and hematuria.  Musculoskeletal:  Negative for arthralgias and neck pain.  Skin:  Negative for rash.  Neurological:  Negative for dizziness, weakness, light-headedness, numbness and headaches.  Hematological:  Negative for adenopathy.  Psychiatric/Behavioral:  Negative for behavioral problems, dysphoric mood and sleep disturbance.    Per HPI unless specifically indicated above     Objective:    BP 110/80 (BP Location: Left Arm, Patient Position: Sitting, Cuff Size: Normal)   Pulse 82   Resp 18   Ht 6' (1.829 m)   Wt 249 lb 9.6 oz (113.2 kg)   SpO2 98%   BMI 33.85 kg/m   Wt Readings from Last 3 Encounters:  03/21/24 249 lb 9.6 oz (113.2 kg)  10/16/23 265 lb (120.2 kg)  03/14/23 259 lb (117.5 kg)    Physical Exam Vitals and nursing note reviewed.  Constitutional:      General: He is not in acute distress.    Appearance: He is well-developed. He is not diaphoretic.     Comments: Well-appearing, comfortable, cooperative  HENT:     Head: Normocephalic and atraumatic.  Eyes:     General:        Right eye: No discharge.        Left eye: No discharge.     Conjunctiva/sclera: Conjunctivae normal.     Pupils: Pupils are equal, round, and reactive to light.  Neck:     Thyroid: No  thyromegaly.     Vascular: No carotid bruit.  Cardiovascular:     Rate and Rhythm: Normal rate and regular rhythm.     Pulses: Normal pulses.     Heart sounds: Normal heart sounds. No murmur heard. Pulmonary:     Effort: Pulmonary effort is normal. No respiratory distress.     Breath sounds: Normal breath sounds. No wheezing or rales.  Abdominal:     General: Bowel sounds are normal. There is no distension.     Palpations: Abdomen is soft. There is no mass.     Tenderness: There is no abdominal tenderness.  Musculoskeletal:        General: No tenderness. Normal range of motion.     Cervical back: Normal range of motion and neck supple.     Right lower leg: No edema.     Left lower leg: No edema.     Comments: Upper / Lower Extremities: - Normal muscle tone, strength bilateral upper extremities 5/5, lower extremities 5/5  Lymphadenopathy:     Cervical: No cervical adenopathy.  Skin:    General: Skin is warm and dry.     Findings: Lesion (left lateral ankle with healing skin lesion from biopsy) present. No erythema or rash.  Neurological:     Mental Status: He is alert and oriented to person, place, and time.     Comments: Distal sensation intact to light touch all extremities  Psychiatric:        Mood and Affect: Mood normal.        Behavior: Behavior normal.        Thought Content: Thought content normal.  Comments: Well groomed, good eye contact, normal speech and thoughts     Results for orders placed or performed in visit on 03/07/24  TSH   Collection Time: 03/07/24  8:12 AM  Result Value Ref Range   TSH 1.30 0.40 - 4.50 mIU/L  CBC with Differential/Platelet   Collection Time: 03/07/24  8:12 AM  Result Value Ref Range   WBC 5.7 3.8 - 10.8 Thousand/uL   RBC 5.14 4.20 - 5.80 Million/uL   Hemoglobin 15.6 13.2 - 17.1 g/dL   HCT 16.1 09.6 - 04.5 %   MCV 87.0 80.0 - 100.0 fL   MCH 30.4 27.0 - 33.0 pg   MCHC 34.9 32.0 - 36.0 g/dL   RDW 40.9 81.1 - 91.4 %   Platelets  260 140 - 400 Thousand/uL   MPV 9.9 7.5 - 12.5 fL   Neutro Abs 2,947 1,500 - 7,800 cells/uL   Absolute Lymphocytes 2,001 850 - 3,900 cells/uL   Absolute Monocytes 513 200 - 950 cells/uL   Eosinophils Absolute 188 15 - 500 cells/uL   Basophils Absolute 51 0 - 200 cells/uL   Neutrophils Relative % 51.7 %   Total Lymphocyte 35.1 %   Monocytes Relative 9.0 %   Eosinophils Relative 3.3 %   Basophils Relative 0.9 %  COMPLETE METABOLIC PANEL WITH GFR   Collection Time: 03/07/24  8:12 AM  Result Value Ref Range   Glucose, Bld 84 65 - 99 mg/dL   BUN 26 (H) 7 - 25 mg/dL   Creat 7.82 9.56 - 2.13 mg/dL   BUN/Creatinine Ratio 26 (H) 6 - 22 (calc)   Sodium 138 135 - 146 mmol/L   Potassium 3.9 3.5 - 5.3 mmol/L   Chloride 104 98 - 110 mmol/L   CO2 26 20 - 32 mmol/L   Calcium 9.6 8.6 - 10.3 mg/dL   Total Protein 7.8 6.1 - 8.1 g/dL   Albumin 5.1 3.6 - 5.1 g/dL   Globulin 2.7 1.9 - 3.7 g/dL (calc)   AG Ratio 1.9 1.0 - 2.5 (calc)   Total Bilirubin 0.6 0.2 - 1.2 mg/dL   Alkaline phosphatase (APISO) 68 36 - 130 U/L   AST 18 10 - 40 U/L   ALT 20 9 - 46 U/L  Lipid panel   Collection Time: 03/07/24  8:12 AM  Result Value Ref Range   Cholesterol 142 <200 mg/dL   HDL 36 (L) > OR = 40 mg/dL   Triglycerides 086 <578 mg/dL   LDL Cholesterol (Calc) 84 mg/dL (calc)   Total CHOL/HDL Ratio 3.9 <5.0 (calc)   Non-HDL Cholesterol (Calc) 106 <130 mg/dL (calc)  Hemoglobin I6N   Collection Time: 03/07/24  8:12 AM  Result Value Ref Range   Hgb A1c MFr Bld 5.0 <5.7 % of total Hgb   Mean Plasma Glucose 97 mg/dL   eAG (mmol/L) 5.4 mmol/L      Assessment & Plan:   Problem List Items Addressed This Visit     Essential hypertension   Relevant Medications   losartan  (COZAAR ) 100 MG tablet   Obesity (BMI 30.0-34.9)   Relevant Medications   ZEPBOUND  10 MG/0.5ML Pen   Other Visit Diagnoses       Annual physical exam    -  Primary     Family history of heart disease       Relevant Orders   CT CARDIAC  SCORING (SELF PAY ONLY)     Morbid obesity (HCC)       Relevant Medications  ZEPBOUND  10 MG/0.5ML Pen        Updated Health Maintenance information Reviewed recent lab results with patient Encouraged improvement to lifestyle with diet and exercise Goal of weight loss  Atypical skin lesion Followed by Dermatology Atypical skin lesion with moderate to severe atypia. Scheduled for further excision to ensure complete removal. - Proceed with scheduled excision on May 1.  Suspected sleep apnea Suspected sleep apnea based on symptoms. Zepbound  improved symptoms actually. Consider sleep study to confirm diagnosis if needed for insurance purposes. He will check cost coverage, [facility] HST  Hypertension Hypertension well-controlled with current regimen. Blood pressure improved with increased water intake and weight loss. Discussed potential future reduction of losartan  dose. - Continue losartan  100 mg daily. - Encourage increased water intake. - Consider reducing losartan  dose to half tab 50mg  in the future if blood pressure remains well-controlled.  Elevated BUN levels Slightly elevated BUN likely due to dehydration. Normal creatinine indicates good kidney function. Increased water intake initiated. - Encourage continued increased water intake.  Wellness Visit Routine wellness visit. Vitals normal. Blood pressure well-controlled. Discussed health maintenance and screenings. Coronary artery calcium score discussed due to family history. Advised age < 45 is not preferred for this CT screening but it is reasonable as it is self pay cost if he wants to pursue it given his fam history. - Schedule coronary artery calcium score due to family history of cardiac abnormalities.         Orders Placed This Encounter  Procedures   CT CARDIAC SCORING (SELF PAY ONLY)    Standing Status:   Future    Expiration Date:   03/21/2025    Preferred imaging location?:   [facility]     Meds ordered this encounter  Medications   losartan  (COZAAR ) 100 MG tablet    Sig: Take 1 tablet (100 mg total) by mouth daily.    Dispense:  90 tablet    Refill:  3   ZEPBOUND  10 MG/0.5ML Pen    Sig: Inject 10 mg into the skin once a week.    Dispense:  2 mL    Refill:  5     Follow up plan: Return in about 1 year (around 03/21/2025) for 1 year fasting lab > 1 week later Annual Physical.  Future labs 03/17/25  Domingo Friend, DO [facility] 03/21/2024, 9:58 AM

## 2024-03-21 NOTE — Telephone Encounter (Signed)
 Robert Rose (Key: [facility]) Rx #: [id number] Zepbound  10MG /0.5ML pen-injectors Form [employer] Electronic PA Form [phone] NCPDP) Created 3 hours ago Sent to Plan 3 minutes ago Plan Response 3 minutes ago Submit Clinical Questions Determination Message from Plan An active PA is already on file with expiration date of 06/13/2024. Please wait to resubmit request within 60 days of that expiration date to obtain a PA renewal.

## 2024-03-26 ENCOUNTER — Encounter: Admitting: Family Medicine

## 2024-04-06 ENCOUNTER — Other Ambulatory Visit: Payer: Self-pay | Admitting: Family Medicine

## 2024-04-06 DIAGNOSIS — I10 Essential (primary) hypertension: Secondary | ICD-10-CM

## 2024-04-08 NOTE — Telephone Encounter (Signed)
 Change of pharmacy  Requested Prescriptions  Pending Prescriptions Disp Refills   losartan  (COZAAR ) 100 MG tablet [Pharmacy Med Name: LOSARTAN  POTASSIUM 100 MG TAB] 90 tablet 3    Sig: TAKE 1 TABLET BY MOUTH EVERY DAY     Cardiovascular:  Angiotensin Receptor Blockers Failed - 04/08/2024 11:17 AM      Failed - Valid encounter within last 6 months    Recent Outpatient Visits           2 weeks ago Annual physical exam   [facility] [place], Kayleen Party, DO       Future Appointments             In 11 months [place], Kayleen Party, DO [facility]            Passed - Cr in normal range and within 180 days    Creat  Date Value Ref Range Status  03/07/2024 1.01 0.60 - 1.26 mg/dL Final         Passed - K in normal range and within 180 days    Potassium  Date Value Ref Range Status  03/07/2024 3.9 3.5 - 5.3 mmol/L Final         Passed - Patient is not pregnant      Passed - Last BP in normal range    BP Readings from Last 1 Encounters:  03/21/24 110/80

## 2024-05-17 ENCOUNTER — Encounter: Payer: Self-pay | Admitting: Family Medicine

## 2024-05-19 MED ORDER — ZEPBOUND 12.5 MG/0.5ML ~~LOC~~ SOAJ: 12.5000 mg | SUBCUTANEOUS | 0 refills | Status: DC

## 2024-06-25 ENCOUNTER — Encounter: Payer: Self-pay | Admitting: Family Medicine

## 2024-06-25 ENCOUNTER — Other Ambulatory Visit (HOSPITAL_COMMUNITY): Payer: Self-pay

## 2024-06-25 ENCOUNTER — Telehealth: Payer: Self-pay

## 2024-06-25 NOTE — Telephone Encounter (Signed)
 Pharmacy Patient Advocate Encounter   Received notification from CoverMyMeds that prior authorization for Zepbound  12.5MG /0.5ML pen-injectors is required/requested.   Insurance verification completed.   The patient is insured through [employer] .   Per test claim: PA required; PA submitted to above mentioned insurance via CoverMyMeds Key/confirmation #/EOC [facility] Status is pending

## 2024-06-27 ENCOUNTER — Other Ambulatory Visit (HOSPITAL_COMMUNITY): Payer: Self-pay

## 2024-06-27 NOTE — Telephone Encounter (Signed)
 Both Wegovy  and Zepbound  require a PA, if considering changing med. PA has been started for Zepbound  and submitted 2 days ago, thank you.

## 2024-06-30 ENCOUNTER — Other Ambulatory Visit (HOSPITAL_COMMUNITY): Payer: Self-pay

## 2024-07-02 NOTE — Telephone Encounter (Signed)
 Hello, regarding the PA denial on Zepbound . Could you help clarify - it looks like the PA was submitted but I don't see that the patient followed up for a 3 month weight check to check his progress.  Was the denial based on his weight from 02/2023 to 02/2024?  Or does he need to return now for weight repeat after 02/2024 now that it has been 4 months to check weight to determine if he was able to lose 5% body weight?  Marsa Officer, DO [facility] 07/02/2024, 3:57 PM

## 2024-07-02 NOTE — Telephone Encounter (Signed)
 Pharmacy Patient Advocate Encounter  Received notification from [employer] that Prior Authorization for Zepbound  12.5mg  has been DENIED.  Full denial letter will be uploaded to the media tab. See denial reason below.   There is no indication your patient has lost at least 5% of their baseline body weight. Note: This refers to baseline prior to any glucagon-like peptide-1 (GLP-1) agonist (for example, Saxenda, Wegovy ) or GLP-1/glucose-dependent insulinotropic polypeptide (GIP) receptor agonist (for example, Zepbound ).   PA #/Case ID/Reference #: [id number]   Morene Potters, CPhT Supervisor Pharmacy Patient Advocate [facility] [phone] (Ph) 07/02/2024 2:03 PM

## 2024-07-03 NOTE — Telephone Encounter (Signed)
 Yes, apologies, chart notes from 02/2024 were submitted as the most recent weight. If we can get pt's current weight, I am happy to resubmit or appeal asap! Thank you.

## 2024-07-21 ENCOUNTER — Ambulatory Visit: Admitting: Family Medicine

## 2024-08-04 ENCOUNTER — Encounter: Payer: Self-pay | Admitting: Family Medicine

## 2024-08-06 ENCOUNTER — Ambulatory Visit: Admitting: Family Medicine

## 2024-08-06 ENCOUNTER — Encounter: Payer: Self-pay | Admitting: Family Medicine

## 2024-08-06 DIAGNOSIS — E7800 Pure hypercholesterolemia, unspecified: Secondary | ICD-10-CM | POA: Diagnosis not present

## 2024-08-06 DIAGNOSIS — I10 Essential (primary) hypertension: Secondary | ICD-10-CM | POA: Diagnosis not present

## 2024-08-06 MED ORDER — CONTRAVE 8-90 MG PO TB12: ORAL_TABLET | ORAL | 0 refills | Status: DC

## 2024-08-06 NOTE — Progress Notes (Unsigned)
 Subjective:    Patient ID: Robert Rose, male    DOB: 12/13/1990, 33 y.o.   MRN: [id number]  Robert Rose is a 33 y.o. male presenting on 08/06/2024 for Obesity   HPI  Discussed the use of AI scribe software for clinical note transcription with the patient, who gave verbal consent to proceed.  History of Present Illness   Robert Rose is a 33 year old male who presents for a weight management check.  Body weight trajectory and pharmacologic management - Weight increased by approximately 8 pounds over the past 2 months after discontinuing weight management medication. - Weight history over the past 2 years: peak of 272 pounds, reduction to 249 pounds, recent weight of 265 pounds. - Initial treatment with Wegovy  resulted in weight reduction to 249 pounds. - Subsequent switch to Zepbound , starting at 259 pounds, with both medications effective in reducing weight to the 240s prior to discontinuation. - Insurance denied coverage for Zepbound  despite perceived eligibility, due to lack of weight follow-up and documentation of successful 5% body weight loss - Three vacations during the period off medication contributed to difficulty maintaining weight. - Now off of Zepbound  for past 2 months  Blood pressure and antihypertensive therapy - Experimenting with taking losartan  at night, which he feels has improved blood pressure control. - Current blood pressure and heart rate monitoring shows heart rate between 60 and 70 beats per minute.      08/06/2024   11:32 AM 03/21/2024    9:38 AM 10/16/2023   11:07 AM  Depression screen PHQ 2/9  Decreased Interest 0 0 0  Down, Depressed, Hopeless 0 0 0  PHQ - 2 Score 0 0 0  Altered sleeping 1 0 0  Tired, decreased energy 1 1 1   Change in appetite 2 0 1  Feeling bad or failure about yourself  0 0 0  Trouble concentrating 0 1 1  Moving slowly or fidgety/restless 0 0 1  Suicidal thoughts 0 0 0  PHQ-9 Score 4 2 4   Difficult doing  work/chores Not difficult at all Not difficult at all        08/06/2024   11:33 AM 03/21/2024    9:38 AM 10/16/2023   11:08 AM 03/14/2023    9:21 AM  GAD 7 : Generalized Anxiety Score  Nervous, Anxious, on Edge 0 1 0 0  Control/stop worrying 0 0 0 0  Worry too much - different things 0 0 0 0  Trouble relaxing 0 1 0 0  Restless 0 1 0 0  Easily annoyed or irritable 0 1 1 2   Afraid - awful might happen 0 0 0 0  Total GAD 7 Score 0 4 1 2   Anxiety Difficulty Not difficult at all Not difficult at all Not difficult at all     Social History   Tobacco Use   Smoking status: Never   Smokeless tobacco: Never  Substance Use Topics   Alcohol use: Yes   Drug use: No    Review of Systems Per HPI unless specifically indicated above     Objective:    BP 132/80 (BP Location: Right Arm, Patient Position: Sitting, Cuff Size: Normal)   Pulse 62   Ht 6' (1.829 m)   Wt 259 lb 6 oz (117.7 kg)   BMI 35.18 kg/m   Wt Readings from Last 3 Encounters:  08/06/24 259 lb 6 oz (117.7 kg)  03/21/24 249 lb 9.6 oz (113.2 kg)  10/16/23 265 lb (  120.2 kg)    Physical Exam Vitals and nursing note reviewed.  Constitutional:      General: He is not in acute distress.    Appearance: Normal appearance. He is well-developed. He is obese. He is not diaphoretic.     Comments: Well-appearing, comfortable, cooperative  HENT:     Head: Normocephalic and atraumatic.  Eyes:     General:        Right eye: No discharge.        Left eye: No discharge.     Conjunctiva/sclera: Conjunctivae normal.  Cardiovascular:     Rate and Rhythm: Normal rate.  Pulmonary:     Effort: Pulmonary effort is normal.  Skin:    General: Skin is warm and dry.     Findings: No erythema or rash.  Neurological:     Mental Status: He is alert and oriented to person, place, and time.  Psychiatric:        Mood and Affect: Mood normal.        Behavior: Behavior normal.        Thought Content: Thought content normal.     Comments:  Well groomed, good eye contact, normal speech and thoughts     Results for orders placed or performed in visit on 03/07/24  TSH   Collection Time: 03/07/24  8:12 AM  Result Value Ref Range   TSH 1.30 0.40 - 4.50 mIU/L  CBC with Differential/Platelet   Collection Time: 03/07/24  8:12 AM  Result Value Ref Range   WBC 5.7 3.8 - 10.8 Thousand/uL   RBC 5.14 4.20 - 5.80 Million/uL   Hemoglobin 15.6 13.2 - 17.1 g/dL   HCT 55.2 61.4 - 49.9 %   MCV 87.0 80.0 - 100.0 fL   MCH 30.4 27.0 - 33.0 pg   MCHC 34.9 32.0 - 36.0 g/dL   RDW 87.1 88.9 - 84.9 %   Platelets 260 140 - 400 Thousand/uL   MPV 9.9 7.5 - 12.5 fL   Neutro Abs 2,947 1,500 - 7,800 cells/uL   Absolute Lymphocytes 2,001 850 - 3,900 cells/uL   Absolute Monocytes 513 200 - 950 cells/uL   Eosinophils Absolute 188 15 - 500 cells/uL   Basophils Absolute 51 0 - 200 cells/uL   Neutrophils Relative % 51.7 %   Total Lymphocyte 35.1 %   Monocytes Relative 9.0 %   Eosinophils Relative 3.3 %   Basophils Relative 0.9 %  COMPLETE METABOLIC PANEL WITH GFR   Collection Time: 03/07/24  8:12 AM  Result Value Ref Range   Glucose, Bld 84 65 - 99 mg/dL   BUN 26 (H) 7 - 25 mg/dL   Creat 8.98 9.39 - 8.73 mg/dL   BUN/Creatinine Ratio 26 (H) 6 - 22 (calc)   Sodium 138 135 - 146 mmol/L   Potassium 3.9 3.5 - 5.3 mmol/L   Chloride 104 98 - 110 mmol/L   CO2 26 20 - 32 mmol/L   Calcium 9.6 8.6 - 10.3 mg/dL   Total Protein 7.8 6.1 - 8.1 g/dL   Albumin 5.1 3.6 - 5.1 g/dL   Globulin 2.7 1.9 - 3.7 g/dL (calc)   AG Ratio 1.9 1.0 - 2.5 (calc)   Total Bilirubin 0.6 0.2 - 1.2 mg/dL   Alkaline phosphatase (APISO) 68 36 - 130 U/L   AST 18 10 - 40 U/L   ALT 20 9 - 46 U/L  Lipid panel   Collection Time: 03/07/24  8:12 AM  Result Value Ref Range  Cholesterol 142 <200 mg/dL   HDL 36 (L) > OR = 40 mg/dL   Triglycerides 871 <849 mg/dL   LDL Cholesterol (Calc) 84 mg/dL (calc)   Total CHOL/HDL Ratio 3.9 <5.0 (calc)   Non-HDL Cholesterol (Calc) 106 <130  mg/dL (calc)  Hemoglobin J8r   Collection Time: 03/07/24  8:12 AM  Result Value Ref Range   Hgb A1c MFr Bld 5.0 <5.7 % of total Hgb   Mean Plasma Glucose 97 mg/dL   eAG (mmol/L) 5.4 mmol/L      Assessment & Plan:   Problem List Items Addressed This Visit     Essential hypertension   Morbid obesity (HCC) - Primary   Relevant Medications   CONTRAVE  8-90 MG TB12   Other Visit Diagnoses       Elevated LDL cholesterol level           Morbid Obesity BMI >35 With associated comorbid conditions dyslipidemia, hypertension Prior history past 2 years on GLP1 therapy with success. Initially Wegovy  then Zepbound .  Obesity management complicated by insurance denial of Wegovy  and Zepbound , resulting in weight gain. Insurance requires 5% weight loss for Zepbound  coverage. - Restart Wegovy  or Zepbound  if insurance permits. - Initiate Contrave  with 7-day sample, order via [employer]. - Enroll in Contrave  Curr Rx Access Program for $99/month. - Monitor weight, adjust treatment as needed. - Follow-up in three months for weight check.  Lifestyle Documentation  We had a detailed discussion today reviewing his course of lifestyle modifications to assist in his weight loss and promote overall health. He remains motivated and committed to continuing these efforts while he continues to take weight loss / appetite reducing medication.  For diet he has been following caloric reduced diet with emphasis on high protein foods and low carbohydrate intake based on structured meal plan.   For exercise, he has been continuing a structured regimen of cardiovascular exercise with walking 20-30 min per day. He will continue to improve mileage and step counts and track exercise levels.  - Advise using apps like My Fitness Pal to track caloric intake. - Schedule follow-up in three months to assess weight and medication efficacy.    Hypertension Hypertension managed with losartan , showing some  improvement. Discussed family cardiovascular history and medication impact on heart rate and blood pressure. - Continue losartan , monitor blood pressure and heart rate. - Adjust medication timing based on blood pressure readings.       No orders of the defined types were placed in this encounter.   Meds ordered this encounter  Medications   CONTRAVE  8-90 MG TB12    Sig: Week 1: Take 1 tab daily with breakfast. Week 2: Take 1 tab twice daily with meal. Week 3: Take 2 tab with AM meal and 1 tab with PM meal. Week 4+: Take 2 tabs twice daily with meals - continue for weight loss    Dispense:  120 tablet    Refill:  0    Follow up plan: Return in about 3 months (around 11/05/2024) for 3 month Weight Check (Contrave ).   Marsa Officer, DO [facility] 08/06/2024, 11:11 AM

## 2024-08-06 NOTE — Patient Instructions (Addendum)
 Thank you for coming to the office today.  Try 1 week sample Contrave  - Week 1: 1 pill daily with meal - Week 2: 1 pill twice a day with meal - Week 3: 2 pill with meal in AM and 1 pill with PM meal - Week 4: 2 pill twice a day with meal  Sign up for [facility] Account  Remain off Zepbound   We can reconsider Wegovy  in future as a restart if you are interested.  Please schedule a Follow-up Appointment to: Return in about 3 months (around 11/05/2024) for 3 month Weight Check (Contrave ).  If you have any other questions or concerns, please feel free to call the office or send a message through MyChart. You may also schedule an earlier appointment if necessary.  Additionally, you may be receiving a survey about your experience at our office within a few days to 1 week by e-mail or mail. We value your feedback.  Marsa Officer, DO [facility], [place]

## 2024-08-25 ENCOUNTER — Emergency Department

## 2024-08-25 ENCOUNTER — Emergency Department
Admission: EM | Admit: 2024-08-25 | Discharge: 2024-08-25 | Disposition: A | Discharge status: Home / Self Care | Attending: Emergency Medicine | Admitting: Emergency Medicine

## 2024-08-25 ENCOUNTER — Encounter: Payer: Self-pay | Admitting: Emergency Medicine

## 2024-08-25 ENCOUNTER — Other Ambulatory Visit: Payer: Self-pay

## 2024-08-25 DIAGNOSIS — R1010 Upper abdominal pain, unspecified: Secondary | ICD-10-CM | POA: Diagnosis present

## 2024-08-25 DIAGNOSIS — I10 Essential (primary) hypertension: Secondary | ICD-10-CM | POA: Diagnosis not present

## 2024-08-25 DIAGNOSIS — K219 Gastro-esophageal reflux disease without esophagitis: Secondary | ICD-10-CM | POA: Diagnosis not present

## 2024-08-25 LAB — COMPREHENSIVE METABOLIC PANEL WITH GFR
ALT: 45 U/L — ABNORMAL HIGH (ref 0–44)
AST: 32 U/L (ref 15–41)
Albumin: 4.5 g/dL (ref 3.5–5.0)
Alkaline Phosphatase: 65 U/L (ref 38–126)
Anion gap: 12 (ref 5–15)
BUN: 16 mg/dL (ref 6–20)
CO2: 24 mmol/L (ref 22–32)
Calcium: 9.2 mg/dL (ref 8.9–10.3)
Chloride: 102 mmol/L (ref 98–111)
Creatinine, Ser: 1.1 mg/dL (ref 0.61–1.24)
GFR, Estimated: >60 mL/min (ref >60)
Glucose, Bld: 110 mg/dL — ABNORMAL HIGH (ref 70–99)
Potassium: 4 mmol/L (ref 3.5–5.1)
Sodium: 138 mmol/L (ref 135–145)
Total Bilirubin: 0.7 mg/dL (ref 0.0–1.2)
Total Protein: 8.2 g/dL — ABNORMAL HIGH (ref 6.5–8.1)

## 2024-08-25 LAB — CBC
HCT: 42.4 % (ref 39.0–52.0)
Hemoglobin: 14.5 g/dL (ref 13.0–17.0)
MCH: 29.8 pg (ref 26.0–34.0)
MCHC: 34.2 g/dL (ref 30.0–36.0)
MCV: 87.2 fL (ref 80.0–100.0)
Platelets: 255 K/uL (ref 150–400)
RBC: 4.86 MIL/uL (ref 4.22–5.81)
RDW: 12.5 % (ref 11.5–15.5)
WBC: 7.2 K/uL (ref 4.0–10.5)
nRBC: 0 % (ref 0.0–0.2)

## 2024-08-25 LAB — URINALYSIS, ROUTINE W REFLEX MICROSCOPIC
Bilirubin Urine: NEGATIVE
Glucose, UA: NEGATIVE mg/dL
Hgb urine dipstick: NEGATIVE
Ketones, ur: NEGATIVE mg/dL
Leukocytes,Ua: NEGATIVE
Nitrite: NEGATIVE
Protein, ur: NEGATIVE mg/dL
Specific Gravity, Urine: 1.005 (ref 1.005–1.030)
pH: 6 (ref 5.0–8.0)

## 2024-08-25 LAB — LIPASE, BLOOD: Lipase: 17 U/L (ref 11–51)

## 2024-08-25 LAB — TROPONIN I (HIGH SENSITIVITY): Troponin I (High Sensitivity): 4 ng/L (ref <18)

## 2024-08-25 MED ORDER — ALUMINUM-MAGNESIUM-SIMETHICONE 200-200-20 MG/5ML PO SUSP: 30.0000 mL | Freq: Three times a day (TID) | ORAL | 0 refills | Status: AC

## 2024-08-25 MED ORDER — KETOROLAC TROMETHAMINE 15 MG/ML IJ SOLN
15.0000 mg | Freq: Once | INTRAMUSCULAR | Status: AC
  Administered 2024-08-25: 15 mg via INTRAMUSCULAR
  Filled 2024-08-25: qty 1

## 2024-08-25 MED ORDER — FAMOTIDINE 20 MG PO TABS: 20.0000 mg | ORAL_TABLET | Freq: Two times a day (BID) | ORAL | 0 refills | Status: AC

## 2024-08-25 MED ORDER — METOCLOPRAMIDE HCL 10 MG PO TABS: 10.0000 mg | ORAL_TABLET | Freq: Four times a day (QID) | ORAL | 0 refills | Status: AC | PRN

## 2024-08-25 NOTE — ED Provider Notes (Signed)
 [facility] Provider Note    Event Date/Time   First MD Initiated Contact with Patient 08/25/24 [phone]     (approximate)   History   Chief Complaint: Abdominal Pain   HPI  Robert Rose is a 33 y.o. male with past history of hypertension who comes ED complaining of upper abdominal pain starting yesterday evening around 11:00 PM.  Worse after laying down and going to bed.  No fever, no vomiting.  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 No pertinent surgical history.  Physical Exam   Triage Vital Signs: ED Triage Vitals [08/25/24 0344]  Encounter Vitals Group     BP (!) 161/105     Girls Systolic BP Percentile      Girls Diastolic BP Percentile      Boys Systolic BP Percentile      Boys Diastolic BP Percentile      Pulse Rate 88     Resp 16     Temp 98.3 F (36.8 C)     Temp Source Oral     SpO2 99 %     Weight 257 lb (116.6 kg)     Height 6' (1.829 m)     Head Circumference      Peak Flow      Pain Score      Pain Loc      Pain Education      Exclude from Growth Chart     Most recent vital signs: Vitals:   08/25/24 0430 08/25/24 0500  BP: (!) 131/94 (!) 136/104  Pulse: 85 67  Resp:    Temp:    SpO2: 98% 98%    General: Awake, no distress.  CV:  Good peripheral perfusion.  Regular rate rhythm Resp:  Normal effort.  Clear lungs Abd:  No distention.  Soft with mild epigastric tenderness Other:  Moist oral mucosa   ED Results / Procedures / Treatments   Labs (all labs ordered are listed, but only abnormal results are displayed) Labs Reviewed   COMPREHENSIVE METABOLIC PANEL WITH GFR - Abnormal; Notable for the following components:      Result Value   Glucose, Bld 110 (*)    Total Protein 8.2 (*)    ALT 45 (*)    All other components within normal limits  URINALYSIS, ROUTINE W REFLEX MICROSCOPIC - Abnormal; Notable for the following components:   Color, Urine STRAW (*)    APPearance CLEAR (*)    All other components within normal limits  LIPASE, BLOOD  CBC  TROPONIN I (HIGH SENSITIVITY)     EKG Interpreted by me Sinus rhythm rate of 84.  Normal axis, normal intervals.  Normal QRS ST segments T waves   RADIOLOGY Ultrasound right upper quadrant interpreted by me, no signs of cholecystitis.  Radiology report reviewed   PROCEDURES:  Procedures   MEDICATIONS ORDERED IN ED: Medications  ketorolac (TORADOL) 15 MG/ML injection 15 mg (15 mg Intramuscular Given 08/25/24 0507)     IMPRESSION / MDM / ASSESSMENT AND PLAN /  ED COURSE  I reviewed the triage vital signs and the nursing notes.  DDx: GERD/gastritis, pancreatitis, cholecystitis, AKI, electrolyte derangement  Patient's presentation is most consistent with acute presentation with potential threat to life or bodily function.  Patient presents with upper abdominal pain with some mild tenderness on exam.  He is nontoxic, vital signs are essentially normal.  Labs are normal.  Ultrasound is unremarkable.  Stable for discharge, treat with NSAIDs and antacids.       FINAL CLINICAL IMPRESSION(S) / ED DIAGNOSES   Final diagnoses:  Upper abdominal pain  Gastroesophageal reflux disease without esophagitis     Rx / DC Orders   ED Discharge Orders          Ordered    metoCLOPramide (REGLAN) 10 MG tablet  Every 6 hours PRN        08/25/24 0640    famotidine (PEPCID) 20 MG tablet  2 times daily        08/25/24 0640    aluminum-magnesium hydroxide-simethicone (MAALOX) 200-200-20 MG/5ML SUSP  3 times daily before meals & bedtime        08/25/24 0640              Note:  This document was prepared using Dragon voice recognition software and may include unintentional dictation errors.   Viviann Pastor, MD 08/25/24 [phone]

## 2024-08-25 NOTE — Discharge Instructions (Signed)
 Your lab test and ultrasound of the gallbladder were all normal today.  Continue taking medications to lower stomach acid and follow-up with your doctor.

## 2024-08-25 NOTE — ED Triage Notes (Signed)
 Arrived pov, ambulatory to triage with RUQ abd pain.  Per pt I was having pain in my upper abd last night and it started radiating up into my ribs and chest. The pressure in my chest woke me up.  Pain in chest has subsided since taking Pepcid pta

## 2024-09-03 ENCOUNTER — Other Ambulatory Visit: Payer: Self-pay | Admitting: Family Medicine

## 2024-09-05 NOTE — Telephone Encounter (Signed)
 Requested medication (s) are due for refill today: yes  Requested medication (s) are on the active medication list: yes  Last refill:  08/06/24  Future visit scheduled: yes  Notes to clinic:  Medication not assigned to a protocol, review manually.      Requested Prescriptions  Pending Prescriptions Disp Refills   CONTRAVE  8-90 MG TB12 [Pharmacy Med Name: CONTRAVE  ER 8-90 MG TABLET] 120 tablet 0    Sig: Take 1 tablet by mouth daily for 7 days then 1 twice a day for 7 days, then 2 in the am and 1 in the pm for 7 days then take 2 twice daily thereafter.     Off-Protocol Failed - 09/05/2024  8:36 AM      Failed - Medication not assigned to a protocol, review manually.      Passed - Valid encounter within last 12 months    Recent Outpatient Visits           1 month ago Morbid obesity [facility])   [place] [facility] [place], Marsa PARAS, DO   5 months ago Annual physical exam   [place] [facility] [place] Marsa PARAS, DO       Future Appointments             In 6 months [place], Marsa PARAS, DO [place] [facility], Main [address]

## 2024-10-02 ENCOUNTER — Other Ambulatory Visit: Payer: Self-pay | Admitting: Internal Medicine

## 2024-10-03 NOTE — Telephone Encounter (Signed)
 Requested medication (s) are due for refill today - yes  Requested medication (s) are on the active medication list -yes  Future visit scheduled -yes  Last refill: 09/05/24 #120  Notes to clinic: off protocol- provider review   Requested Prescriptions  Pending Prescriptions Disp Refills   CONTRAVE  8-90 MG TB12 [Pharmacy Med Name: CONTRAVE  ER 8-90 MG TABLET] 120 tablet 0    Sig: Take 1 tablet by mouth daily for 7 days then 1 twice a day for 7 days, then 2 in the am and 1 in the pm for 7 days then take 2 twice daily thereafter.     Off-Protocol Failed - 10/03/2024  2:14 PM      Failed - Medication not assigned to a protocol, review manually.      Passed - Valid encounter within last 12 months    Recent Outpatient Visits           1 month ago Morbid obesity [facility])   [place] [facility] [place], Marsa PARAS, DO   6 months ago Annual physical exam   [place] [facility] [place], Marsa PARAS, DO       Future Appointments             In 5 months [place], Marsa PARAS, DO [place] [facility], [address]               Requested Prescriptions  Pending Prescriptions Disp Refills   CONTRAVE  8-90 MG TB12 [Pharmacy Med Name: CONTRAVE  ER 8-90 MG TABLET] 120 tablet 0    Sig: Take 1 tablet by mouth daily for 7 days then 1 twice a day for 7 days, then 2 in the am and 1 in the pm for 7 days then take 2 twice daily thereafter.     Off-Protocol Failed - 10/03/2024  2:14 PM      Failed - Medication not assigned to a protocol, review manually.      Passed - Valid encounter within last 12 months    Recent Outpatient Visits           1 month ago Morbid obesity [facility])   [place] [facility] [place], Marsa PARAS, DO   6 months ago Annual physical exam   [place] [facility] [place] Marsa PARAS, DO       Future Appointments             In 5 months [place], Marsa PARAS, DO [place] [facility], Main [address]

## 2024-10-16 ENCOUNTER — Telehealth: Payer: Self-pay | Admitting: Pharmacy Technician

## 2024-10-16 ENCOUNTER — Other Ambulatory Visit (HOSPITAL_COMMUNITY): Payer: Self-pay

## 2024-10-16 NOTE — Telephone Encounter (Signed)
 Pharmacy Patient Advocate Encounter   Received notification from Onbase that prior authorization for Contrave  is required/requested.   Insurance verification completed.   The patient is insured through [employer].   Per test claim:  PHENTERMINE (15/30/37.5mg  caps), DIETHYLPROPION (25/ER 75), or BENZPHETAMINE is preferred by the insurance.  If suggested medication is appropriate, Please send in a new RX and discontinue this one. If not, please advise as to why it's not appropriate so that we may request a Prior Authorization. Please note, some preferred medications may still require a PA.  If the suggested medications have not been trialed and there are no contraindications to their use, the PA will not be submitted, as it will not be approved.

## 2024-10-16 NOTE — Telephone Encounter (Signed)
 Thanks for the information. I'll see if we can get the PA, since he's not eligible to take the others.

## 2024-10-16 NOTE — Telephone Encounter (Signed)
 It looks like this patient was last seen 07/2024 and has apt with me 3 months which is 11/06/24 for weight check and documentation.  So I do not have updated chart information or weight yet because it has not been 3 months.  He is not a candidate for Phentermine, Diethylpropion, or Benzphetamine due to Hypertension history and risk of cardiovascular complications on these medications.  If PA is not possible with this current data, then he should be eligible to apply for the $99 CuRx-Access program through Contrave  manufacturer regardless of PA approval as long as PA is submitted.  Thank you  Marsa Officer, DO [facility] 10/16/2024, 2:19 PM

## 2024-10-16 NOTE — Telephone Encounter (Signed)
 Pharmacy Patient Advocate Encounter   PA required and submitted KEY/EOC/Request #: [id number] from 10/16/24 to 02/13/25. Ran test claim, Copay is $24.97. This test claim was processed through [facility]- copay amounts may vary at other pharmacies due to pharmacy/plan contracts, or as the patient moves through the different stages of their insurance plan.

## 2024-11-03 ENCOUNTER — Other Ambulatory Visit: Payer: Self-pay | Admitting: Family Medicine

## 2024-11-05 NOTE — Telephone Encounter (Signed)
 Requested medication (s) are due for refill today: yes  Requested medication (s) are on the active medication list: yes  Last refill:  10/03/24  Future visit scheduled: yes  Notes to clinic:  Medication not assigned to a protocol, review manuall      Requested Prescriptions  Pending Prescriptions Disp Refills   CONTRAVE  8-90 MG TB12 [Pharmacy Med Name: CONTRAVE  ER 8-90 MG TABLET] 120 tablet 0    Sig: Take 2 tablets by mouth 2 (two) times daily with a meal.     Off-Protocol Failed - 11/05/2024  3:39 PM      Failed - Medication not assigned to a protocol, review manually.      Passed - Valid encounter within last 12 months    Recent Outpatient Visits           3 months ago Morbid obesity [facility])   [place] [facility] [place], Marsa PARAS, DO   7 months ago Annual physical exam   [place] [facility] [place], Marsa PARAS, DO       Future Appointments             In 4 months [place], Marsa PARAS, DO [place] [facility], Main [address]

## 2024-11-06 ENCOUNTER — Ambulatory Visit: Admitting: Family Medicine

## 2024-12-08 ENCOUNTER — Ambulatory Visit: Admitting: Family Medicine

## 2024-12-19 ENCOUNTER — Ambulatory Visit: Admitting: Family Medicine

## 2024-12-19 ENCOUNTER — Encounter: Payer: Self-pay | Admitting: Family Medicine

## 2024-12-19 DIAGNOSIS — I10 Essential (primary) hypertension: Secondary | ICD-10-CM

## 2024-12-19 DIAGNOSIS — Z8249 Family history of ischemic heart disease and other diseases of the circulatory system: Secondary | ICD-10-CM

## 2024-12-19 MED ORDER — ZEPBOUND 5 MG/0.5ML ~~LOC~~ SOAJ: 5.0000 mg | SUBCUTANEOUS | 0 refills | Status: AC

## 2024-12-19 NOTE — Progress Notes (Signed)
 "  Subjective:    Patient ID: Robert Rose, male    DOB: November 21, 1991, 34 y.o.   MRN: [id number]  Robert Rose is a 34 y.o. male presenting on 12/19/2024 for Weight Check and Obesity   HPI  Discussed the use of AI scribe software for clinical note transcription with the patient, who gave verbal consent to proceed.  History of Present Illness   Robert Rose is a 34 year old male who presents with concerns about weight management and medication options.  Morbid Obesity Weight management and pharmacotherapy - Weight loss associated with improvement in snoring and decreased blood pressure between April and September, as tracked by a snore monitoring app. - Previously used Contrave , which reduced cravings for approximately two weeks before becoming ineffective; describes current effect as 'like taking water pills.' Also past history on Wegovy . Now insurance change - Discontinuation of Contrave  led to increased sugar cravings. - Considering alternative weight management medications, including Wegovy  and Zepbound . - Works from home with decreased physical activity, but recently acquired a Peloton bike to increase exercise. He is working from home and now admits issues with inc cravings for sugar etc Insurance utilizes control and instrumentation engineer, and weight checks at home to maintain on medication through [facility] health  Hypertension Blood pressure monitoring - History of elevated blood pressure, with recent home readings around 142/90 mmHg. - Blood pressure was consistently higher a few years ago; recent readings are lower, coinciding with weight loss. On Losartan  100mg  daily  Peripheral edema - Ankle swelling occurs after prolonged walking, such as during travel. - No chest pain or dyspnea. - Low water intake, considered a possible contributing factor.  Family history of cardiovascular disease - Father diagnosed with heart failure ten years ago. - Concerned about personal risk for heart  failure. Interested in testing screening      08/06/2024   11:32 AM 03/21/2024    9:38 AM 10/16/2023   11:07 AM  Depression screen PHQ 2/9  Decreased Interest 0 0 0  Down, Depressed, Hopeless 0 0 0  PHQ - 2 Score 0 0 0  Altered sleeping 1 0 0  Tired, decreased energy 1 1 1   Change in appetite 2 0 1  Feeling bad or failure about yourself  0 0 0  Trouble concentrating 0 1 1  Moving slowly or fidgety/restless 0 0 1  Suicidal thoughts 0 0 0  PHQ-9 Score 4  2  4    Difficult doing work/chores Not difficult at all Not difficult at all      Data saved with a previous flowsheet row definition       08/06/2024   11:33 AM 03/21/2024    9:38 AM 10/16/2023   11:08 AM 03/14/2023    9:21 AM  GAD 7 : Generalized Anxiety Score  Nervous, Anxious, on Edge 0  1  0  0   Control/stop worrying 0  0  0  0   Worry too much - different things 0  0  0  0   Trouble relaxing 0  1  0  0   Restless 0  1  0  0   Easily annoyed or irritable 0  1  1  2    Afraid - awful might happen 0  0  0  0   Total GAD 7 Score 0 4 1 2   Anxiety Difficulty Not difficult at all Not difficult at all Not difficult at all      Data saved with  a previous flowsheet row definition    Social History[1]  Review of Systems Per HPI unless specifically indicated above     Objective:    BP 132/80 (BP Location: Left Arm, Cuff Size: Normal)   Pulse 88   Ht 6' (1.829 m)   Wt 276 lb 8 oz (125.4 kg)   SpO2 98%   BMI 37.50 kg/m   Wt Readings from Last 3 Encounters:  12/19/24 276 lb 8 oz (125.4 kg)  08/25/24 257 lb (116.6 kg)  08/06/24 259 lb 6 oz (117.7 kg)    Physical Exam Vitals and nursing note reviewed.  Constitutional:      General: He is not in acute distress.    Appearance: He is well-developed. He is obese. He is not diaphoretic.     Comments: Well-appearing, comfortable, cooperative  HENT:     Head: Normocephalic and atraumatic.  Eyes:     General:        Right eye: No discharge.        Left eye: No  discharge.     Conjunctiva/sclera: Conjunctivae normal.  Neck:     Thyroid: No thyromegaly.  Cardiovascular:     Rate and Rhythm: Normal rate and regular rhythm.     Pulses: Normal pulses.     Heart sounds: Normal heart sounds. No murmur heard. Pulmonary:     Effort: Pulmonary effort is normal. No respiratory distress.     Breath sounds: Normal breath sounds. No wheezing or rales.  Musculoskeletal:        General: Normal range of motion.     Cervical back: Normal range of motion and neck supple.  Lymphadenopathy:     Cervical: No cervical adenopathy.  Skin:    General: Skin is warm and dry.     Findings: No erythema or rash.  Neurological:     Mental Status: He is alert and oriented to person, place, and time. Mental status is at baseline.  Psychiatric:        Behavior: Behavior normal.     Comments: Well groomed, good eye contact, normal speech and thoughts     Results for orders placed or performed during the hospital encounter of 08/25/24  Troponin I (High Sensitivity)   Collection Time: 08/25/24  3:46 AM  Result Value Ref Range   Troponin I (High Sensitivity) 4 <18 ng/L  Lipase, blood   Collection Time: 08/25/24  3:47 AM  Result Value Ref Range   Lipase 17 11 - 51 U/L  Comprehensive metabolic panel   Collection Time: 08/25/24  3:47 AM  Result Value Ref Range   Sodium 138 135 - 145 mmol/L   Potassium 4.0 3.5 - 5.1 mmol/L   Chloride 102 98 - 111 mmol/L   CO2 24 22 - 32 mmol/L   Glucose, Bld 110 (H) 70 - 99 mg/dL   BUN 16 6 - 20 mg/dL   Creatinine, Ser 8.89 0.61 - 1.24 mg/dL   Calcium 9.2 8.9 - 89.6 mg/dL   Total Protein 8.2 (H) 6.5 - 8.1 g/dL   Albumin 4.5 3.5 - 5.0 g/dL   AST 32 15 - 41 U/L   ALT 45 (H) 0 - 44 U/L   Alkaline Phosphatase 65 38 - 126 U/L   Total Bilirubin 0.7 0.0 - 1.2 mg/dL   GFR, Estimated >39 >39 mL/min   Anion gap 12 5 - 15  CBC   Collection Time: 08/25/24  3:47 AM  Result Value Ref Range   WBC 7.2  4.0 - 10.5 K/uL   RBC 4.86 4.22 - 5.81  MIL/uL   Hemoglobin 14.5 13.0 - 17.0 g/dL   HCT 57.5 60.9 - 47.9 %   MCV 87.2 80.0 - 100.0 fL   MCH 29.8 26.0 - 34.0 pg   MCHC 34.2 30.0 - 36.0 g/dL   RDW 87.4 88.4 - 84.4 %   Platelets 255 150 - 400 K/uL   nRBC 0.0 0.0 - 0.2 %  Urinalysis, Routine w reflex microscopic -Urine, Clean Catch   Collection Time: 08/25/24  3:47 AM  Result Value Ref Range   Color, Urine STRAW (A) YELLOW   APPearance CLEAR (A) CLEAR   Specific Gravity, Urine 1.005 1.005 - 1.030   pH 6.0 5.0 - 8.0   Glucose, UA NEGATIVE NEGATIVE mg/dL   Hgb urine dipstick NEGATIVE NEGATIVE   Bilirubin Urine NEGATIVE NEGATIVE   Ketones, ur NEGATIVE NEGATIVE mg/dL   Protein, ur NEGATIVE NEGATIVE mg/dL   Nitrite NEGATIVE NEGATIVE   Leukocytes,Ua NEGATIVE NEGATIVE      Assessment & Plan:   Problem List Items Addressed This Visit     Essential hypertension   Relevant Orders   CT CARDIAC SCORING (SELF PAY ONLY)   Morbid obesity (HCC) - Primary   Relevant Medications   ZEPBOUND  5 MG/0.5ML Pen   Other Relevant Orders   CT CARDIAC SCORING (SELF PAY ONLY)   Other Visit Diagnoses       Family history of heart disease       Relevant Orders   CT CARDIAC SCORING (SELF PAY ONLY)        Morbid obesity BMI >37 with comorbid condition Hypertension Weight gain and increased sugar cravings after stopping Contrave . Previously successful on Wegovy  in past. Considering Wegovy  or Zepbound  for weight management. Zepbound  preferred due to insurance coverage and efficacy. - Ordered Zepbound  5 mg weekly for one month before titration. - Scheduled follow-up in three months for weight check. - Encouraged Peloton bike use for physical activity.  Essential hypertension Blood pressure variable, improved with weight loss. Mild ankle swelling likely vascular, not cardiac. Family history of heart failure noted. - Monitor blood pressure regularly. Continue Losartan  100mg  daily - Reordered calcium score for heart disease risk assessment.      Family history of Heart Disease Discussed Cardiovascular risk and screening. At age 34 he is lower risk, but has significant fam  history with heart disease Agreed to order Coronary CT Calcium Score earlier than 40   Lifestyle Documentation  We had a detailed discussion today reviewing course of lifestyle modifications to assist in weight loss and promote overall health. He remains motivated and committed to continuing these efforts while continues to start weight loss / appetite reducing GLP1 medication.  For diet he has been following caloric reduced diet, down to 1600-1800 calorie intake with emphasis on high protein foods and low carbohydrate intake based on structured meal plan. He has tracked meals but not logging all caloric intake at this time.   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    Orders Placed This Encounter  Procedures   CT CARDIAC SCORING (SELF PAY ONLY)    Standing Status:   Future    Expiration Date:   12/19/2025    Preferred imaging location?:   [facility]    Meds  ordered this encounter  Medications   ZEPBOUND  5 MG/0.5ML Pen    Sig: Inject 5 mg into the skin once a week.    Dispense:  2 mL    Refill:  0    Follow up plan: Return for 3 month fasting lab > 1 week later Annual Physical.  Future labs ordered for 02/2025  Marsa Officer, DO [facility] 12/19/2024, 9:56 AM     [1]  Social History Tobacco Use   Smoking status: Never   Smokeless tobacco: Never  Substance Use Topics   Alcohol use: Yes   Drug use: No   "

## 2024-12-19 NOTE — Patient Instructions (Addendum)
 Thank you for coming to the office today.  Zepbound  5mg  weekly for 1 month Contact me within 3 weeks before you run out so that I can order next dose 7.5mg  for 1 month Same thing to get to 10mg  dose by our next apt   You have been referred for a Coronary Calcium Score Cardiac CT Scan. This is a screening test for patients aged 36-50+ with cardiovascular risk factors or who are healthy but would be interested in Cardiovascular Screening for heart disease. Even if there is a family history of heart disease, this imaging can be useful. Typically it can be done every 5+ years or at a different timeline we agree on  The scan will look at the chest and mainly focus on the heart and identify early signs of calcium build up or blockages within the heart arteries. It is not 100% accurate for identifying blockages or heart disease, but it is useful to help us  predict who may have some early changes or be at risk in the future for a heart attack or cardiovascular problem.  The results are reviewed by a Cardiologist and they will document the results. It should become available on MyChart. Typically the results are divided into percentiles based on other patients of the same demographic and age. So it will compare your risk to others similar to you. If you have a higher score >99 or higher percentile >75%tile, it is recommended to consider Statin cholesterol therapy and or referral to Cardiologist. I will try to help explain your results and if we have questions we can contact the Cardiologist.  You will be contacted for scheduling. Usually it is done at any imaging facility through [facility], [facility] or [facility].  The cost is $99 flat fee total and it does not go through insurance, so no authorization is required.   DUE for FASTING BLOOD WORK (no food or drink after midnight before the lab appointment, only water or coffee without cream/sugar on the morning  of)  SCHEDULE Lab Only visit in the morning at the clinic for lab draw in 3 MONTHS   - Make sure Lab Only appointment is at about 1 week before your next appointment, so that results will be available  For Lab Results, once available within 2-3 days of blood draw, you can can log in to MyChart online to view your results and a brief explanation. Also, we can discuss results at next follow-up visit.   Please schedule a Follow-up Appointment to: Return for 3 month fasting lab > 1 week later Annual Physical.  If you have any other questions or concerns, please feel free to call the office or send a message through MyChart. You may also schedule an earlier appointment if necessary.  Additionally, you may be receiving a survey about your experience at our office within a few days to 1 week by e-mail or mail. We value your feedback.  Marsa Officer, DO [facility], [place]

## 2024-12-20 ENCOUNTER — Other Ambulatory Visit: Payer: Self-pay | Admitting: Family Medicine

## 2024-12-20 DIAGNOSIS — Z0000 Encounter for general adult medical examination without abnormal findings: Secondary | ICD-10-CM

## 2024-12-22 ENCOUNTER — Encounter: Payer: Self-pay | Admitting: Family Medicine

## 2024-12-31 ENCOUNTER — Other Ambulatory Visit (HOSPITAL_COMMUNITY): Payer: Self-pay

## 2025-01-02 ENCOUNTER — Ambulatory Visit: Payer: Self-pay | Admitting: Family Medicine

## 2025-01-02 ENCOUNTER — Ambulatory Visit: Admission: RE | Admit: 2025-01-02 | Payer: Self-pay | Source: Ambulatory Visit

## 2025-01-02 DIAGNOSIS — Z8249 Family history of ischemic heart disease and other diseases of the circulatory system: Secondary | ICD-10-CM

## 2025-01-02 DIAGNOSIS — I10 Essential (primary) hypertension: Secondary | ICD-10-CM

## 2025-03-17 ENCOUNTER — Other Ambulatory Visit

## 2025-03-24 ENCOUNTER — Encounter: Admitting: Family Medicine
